# Patient Record
Sex: Female | Born: 1965 | Race: Black or African American | Hispanic: No | Smoking: Never smoker
Health system: Southern US, Community
[De-identification: ages and names within clinical notes are randomized; demographics above are authoritative.]

## PROBLEM LIST (undated history)

## (undated) DIAGNOSIS — D573 Sickle-cell trait: Secondary | ICD-10-CM

## (undated) DIAGNOSIS — D649 Anemia, unspecified: Secondary | ICD-10-CM

## (undated) DIAGNOSIS — B079 Viral wart, unspecified: Secondary | ICD-10-CM

## (undated) DIAGNOSIS — M259 Joint disorder, unspecified: Secondary | ICD-10-CM

## (undated) DIAGNOSIS — B009 Herpesviral infection, unspecified: Secondary | ICD-10-CM

## (undated) DIAGNOSIS — B379 Candidiasis, unspecified: Secondary | ICD-10-CM

## (undated) DIAGNOSIS — M329 Systemic lupus erythematosus, unspecified: Secondary | ICD-10-CM

## (undated) DIAGNOSIS — A599 Trichomoniasis, unspecified: Secondary | ICD-10-CM

## (undated) HISTORY — DX: Candidiasis, unspecified: B37.9

## (undated) HISTORY — DX: Anemia, unspecified: D64.9

## (undated) HISTORY — DX: Trichomoniasis, unspecified: A59.9

## (undated) HISTORY — DX: Joint disorder, unspecified: M25.9

## (undated) HISTORY — PX: CHOLECYSTECTOMY: SHX55

## (undated) HISTORY — DX: Herpesviral infection, unspecified: B00.9

## (undated) HISTORY — DX: Systemic lupus erythematosus, unspecified: M32.9

## (undated) HISTORY — DX: Sickle-cell trait: D57.3

## (undated) HISTORY — DX: Viral wart, unspecified: B07.9

---

## 1999-03-25 ENCOUNTER — Other Ambulatory Visit: Admission: RE | Admit: 1999-03-25 | Discharge: 1999-03-25 | Payer: Self-pay | Admitting: Obstetrics and Gynecology

## 2009-11-14 ENCOUNTER — Ambulatory Visit: Payer: Self-pay | Admitting: Rheumatology

## 2010-06-27 ENCOUNTER — Observation Stay (HOSPITAL_COMMUNITY): Admission: EM | Admit: 2010-06-27 | Discharge: 2010-06-28 | Payer: Self-pay | Admitting: Emergency Medicine

## 2010-06-27 ENCOUNTER — Emergency Department (HOSPITAL_COMMUNITY): Admission: EM | Admit: 2010-06-27 | Discharge: 2010-06-27 | Payer: Self-pay | Admitting: Family Medicine

## 2010-06-27 ENCOUNTER — Ambulatory Visit: Payer: Self-pay | Admitting: Cardiology

## 2010-06-28 ENCOUNTER — Encounter (INDEPENDENT_AMBULATORY_CARE_PROVIDER_SITE_OTHER): Payer: Self-pay | Admitting: Emergency Medicine

## 2010-07-01 ENCOUNTER — Ambulatory Visit: Payer: Self-pay | Admitting: Rheumatology

## 2011-01-24 LAB — POCT CARDIAC MARKERS
Myoglobin, poc: 49 ng/mL (ref 12–200)
Myoglobin, poc: 52.8 ng/mL (ref 12–200)
Troponin i, poc: <0.05 ng/mL (ref 0.00–0.09)
Troponin i, poc: <0.05 ng/mL (ref 0.00–0.09)

## 2011-01-24 LAB — URINE MICROSCOPIC-ADD ON

## 2011-01-24 LAB — URINALYSIS, ROUTINE W REFLEX MICROSCOPIC
Protein, ur: NEGATIVE mg/dL
Specific Gravity, Urine: 1.015 (ref 1.005–1.030)
Urobilinogen, UA: 1 mg/dL (ref 0.0–1.0)
pH: 6.5 (ref 5.0–8.0)

## 2011-01-24 LAB — POCT I-STAT, CHEM 8
BUN: 8 mg/dL (ref 6–23)
Glucose, Bld: 75 mg/dL (ref 70–99)
HCT: 36 % (ref 36.0–46.0)
Hemoglobin: 12.2 g/dL (ref 12.0–15.0)
Sodium: 140 mEq/L (ref 135–145)

## 2011-01-24 LAB — PREGNANCY, URINE: Preg Test, Ur: NEGATIVE

## 2011-01-24 LAB — CBC
MCH: 29.6 pg (ref 26.0–34.0)
MCV: 85.2 fL (ref 78.0–100.0)
RBC: 4.12 MIL/uL (ref 3.87–5.11)
RDW: 12.2 % (ref 11.5–15.5)

## 2011-01-24 LAB — COMPREHENSIVE METABOLIC PANEL
CO2: 27 mEq/L (ref 19–32)
Chloride: 102 mEq/L (ref 96–112)
Creatinine, Ser: 0.78 mg/dL (ref 0.4–1.2)
GFR calc Af Amer: >60 mL/min (ref >60)
Potassium: 3.6 mEq/L (ref 3.5–5.1)
Sodium: 134 mEq/L — ABNORMAL LOW (ref 135–145)

## 2013-01-12 ENCOUNTER — Ambulatory Visit: Payer: 59 | Admitting: Obstetrics and Gynecology

## 2013-01-12 ENCOUNTER — Encounter: Payer: Self-pay | Admitting: Obstetrics and Gynecology

## 2013-01-12 VITALS — BP 110/80 | Resp 16 | Ht 68.0 in | Wt 213.0 lb

## 2013-01-12 DIAGNOSIS — M329 Systemic lupus erythematosus, unspecified: Secondary | ICD-10-CM

## 2013-01-12 DIAGNOSIS — D649 Anemia, unspecified: Secondary | ICD-10-CM

## 2013-01-12 DIAGNOSIS — D573 Sickle-cell trait: Secondary | ICD-10-CM | POA: Insufficient documentation

## 2013-01-12 DIAGNOSIS — Z124 Encounter for screening for malignant neoplasm of cervix: Secondary | ICD-10-CM

## 2013-01-12 HISTORY — DX: Systemic lupus erythematosus, unspecified: M32.9

## 2013-01-12 MED ORDER — MEDROXYPROGESTERONE ACETATE 150 MG/ML IM SUSP: 150.0000 mg | Freq: Once | INTRAMUSCULAR | Status: DC

## 2013-01-12 NOTE — Progress Notes (Signed)
New pt Annual Exam:  Last Pap: 04-04-11 at [facility] in [place], [place]  WNL: Yes Regular Periods:yes Contraception: NONE   Monthly Breast exam:no Tetanus<75yrs:yes Nl.Bladder Function:yes Daily BMs:yes Healthy Diet:yes Calcium:no Mammogram:no Date of Mammogram: Never Exercise:no Have often Exercise: Never Seatbelt: yes Abuse at home: no Stressful work:yes Sigmoid-colonoscopy: Never Bone Density: No PCP: NO PCP  Change in PMH:Dx w/  Lupus in 2003-04-04  Change in FMH:Mom 04-04-03) and MaternalAunt died of Lupus   Subjective:    Charlotte Cummings is a 47 y.o. female, G4P2, who presents for an annual exam.     History   Social History  . Marital Status: Divorced    Spouse Name: N/A    Number of Children: N/A  . Years of Education: N/A   Social History Main Topics  . Smoking status: Never Smoker   . Smokeless tobacco: Never Used  . Alcohol Use: No  . Drug Use: No  . Sexually Active: Yes -- Female partner(s)    Birth Control/ Protection: None   Other Topics Concern  . None   Social History Narrative  . None    Menstrual cycle:   LMP: Patient's last menstrual period was 12/26/2012.           Cycle: regular for 3-4 days, no IM bleeding    Used Depo Provera between her children with amenorrhea with injections  The following portions of the patient's history were reviewed and updated as appropriate: allergies, current medications, past family history, past medical history, past social history, past surgical history and problem list.  Review of Systems Pertinent items are noted in HPI. Breast:Negative for breast lump,nipple discharge or nipple retraction Gastrointestinal: Negative for abdominal pain, change in bowel habits or rectal bleeding Urinary:negative   Objective:    BP 110/80  Resp 16  Ht 5\' 8"  (1.727 m)  Wt 213 lb (96.616 kg)  BMI 32.39 kg/m2  LMP 12/26/2012    Weight:  Wt Readings from Last 1 Encounters:  01/12/13 213 lb (96.616 kg)          BMI: Body mass  index is 32.39 kg/(m^2).  General Appearance: Alert, appropriate appearance for age. No acute distress HEENT: Grossly normal Neck / Thyroid: Supple, no masses, nodes or enlargement Lungs: clear to auscultation bilaterally Back: No CVA tenderness Breast Exam: No masses or nodes.No dimpling, nipple retraction or discharge. Cardiovascular: Regular rate and rhythm. S1, S2, no murmur Gastrointestinal: Soft, non-tender, no masses or organomegaly Pelvic Exam: Vulva and vagina appear normal. Bimanual exam reveals normal uterus and adnexa. Rectovaginal: normal rectal, no masses Lymphatic Exam: Non-palpable nodes in neck, clavicular, axillary, or inguinal regions Skin: no rash or abnormalities Neurologic: Normal gait and speech, no tremor  Psychiatric: Alert and oriented, appropriate affect.   Wet Prep:not applicable Urinalysis:not applicable UPT: Not done   Assessment:    Normal gyn exam SLE with joint pain as only current sx.  followed by Dr. Gavin Potters in [place] hx SLE   Plan:    Mammogram ordered pap smear with HR HPV return annually or prn Contraception:Depo-Provera  Pt has discussed this choice with her rheumatologist and he concurs First injection to be given within first 5 days of menses per protocol, then q 12 weeks

## 2013-01-14 LAB — PAP IG AND HPV HIGH-RISK

## 2013-01-24 ENCOUNTER — Ambulatory Visit
Admission: RE | Admit: 2013-01-24 | Discharge: 2013-01-24 | Disposition: A | Discharge status: Home / Self Care | Payer: 59 | Source: Ambulatory Visit | Attending: Obstetrics and Gynecology | Admitting: Obstetrics and Gynecology

## 2013-01-24 ENCOUNTER — Telehealth: Payer: Self-pay | Admitting: Obstetrics and Gynecology

## 2013-01-24 DIAGNOSIS — Z1231 Encounter for screening mammogram for malignant neoplasm of breast: Secondary | ICD-10-CM

## 2013-01-24 NOTE — Telephone Encounter (Signed)
Spoke with pt but call got disconnected

## 2013-01-25 ENCOUNTER — Telehealth: Payer: Self-pay

## 2013-01-25 ENCOUNTER — Other Ambulatory Visit: Payer: Self-pay | Admitting: Obstetrics and Gynecology

## 2013-01-25 NOTE — Telephone Encounter (Signed)
Tc to pt yesterday rgd rx for depo injection. Advised pt that refills should still be available at her pharmacy before call dropped. Attempted to return pt call, no answer.

## 2013-01-27 ENCOUNTER — Encounter: Payer: Self-pay | Admitting: Obstetrics and Gynecology

## 2013-01-27 DIAGNOSIS — R922 Inconclusive mammogram: Secondary | ICD-10-CM

## 2013-01-27 DIAGNOSIS — R9230 Dense breasts, unspecified: Secondary | ICD-10-CM

## 2013-01-27 HISTORY — DX: Dense breasts, unspecified: R92.30

## 2013-03-21 ENCOUNTER — Ambulatory Visit
Admission: RE | Admit: 2013-03-21 | Discharge: 2013-03-21 | Disposition: A | Discharge status: Home / Self Care | Payer: 59 | Source: Ambulatory Visit | Attending: Obstetrics and Gynecology | Admitting: Obstetrics and Gynecology

## 2013-03-21 DIAGNOSIS — R928 Other abnormal and inconclusive findings on diagnostic imaging of breast: Secondary | ICD-10-CM

## 2014-09-11 ENCOUNTER — Encounter: Payer: Self-pay | Admitting: Obstetrics and Gynecology

## 2015-06-20 DIAGNOSIS — Z79899 Other long term (current) drug therapy: Secondary | ICD-10-CM

## 2015-06-20 DIAGNOSIS — R5383 Other fatigue: Secondary | ICD-10-CM

## 2015-06-20 HISTORY — DX: Other fatigue: R53.83

## 2015-06-20 HISTORY — DX: Other long term (current) drug therapy: Z79.899

## 2019-04-26 DIAGNOSIS — I10 Essential (primary) hypertension: Secondary | ICD-10-CM

## 2019-04-26 HISTORY — DX: Essential (primary) hypertension: I10

## 2019-06-22 ENCOUNTER — Other Ambulatory Visit (HOSPITAL_COMMUNITY)
Admission: RE | Admit: 2019-06-22 | Discharge: 2019-06-22 | Disposition: A | Discharge status: Home / Self Care | Payer: Medicaid Other | Source: Ambulatory Visit | Attending: Obstetrics and Gynecology | Admitting: Obstetrics and Gynecology

## 2019-06-22 ENCOUNTER — Encounter: Payer: Self-pay | Admitting: Obstetrics and Gynecology

## 2019-06-22 ENCOUNTER — Ambulatory Visit: Payer: Medicaid Other | Admitting: Obstetrics and Gynecology

## 2019-06-22 ENCOUNTER — Other Ambulatory Visit: Payer: Self-pay

## 2019-06-22 DIAGNOSIS — Z01419 Encounter for gynecological examination (general) (routine) without abnormal findings: Secondary | ICD-10-CM | POA: Insufficient documentation

## 2019-06-22 DIAGNOSIS — Z0000 Encounter for general adult medical examination without abnormal findings: Secondary | ICD-10-CM

## 2019-06-22 HISTORY — DX: Encounter for gynecological examination (general) (routine) without abnormal findings: Z01.419

## 2019-06-22 NOTE — Progress Notes (Signed)
Pt is here for annual gyn exam. Pt reports last pap 02/2018, normal. Pt reports last MMG was also 02/2018, normal. Pt reports she has not ever had a colonoscopy. Pt would like to get back on Depo-provera for contraception.

## 2019-06-22 NOTE — Progress Notes (Signed)
Charlotte Cummings is a 53 y.o. [phone] female here for a routine annual gynecologic exam. She desires contraception. Sexual active. Monthly cycles. Some menopausal Sx but also has SLE. Previously used Depo Provera, but stopped over a year ago.    Denies abnormal vaginal bleeding, discharge, pelvic pain, problems with intercourse or other gynecologic concerns.    Gynecologic History Patient's last menstrual period was 05/27/2019 (exact date). Contraception: none Last Pap: 2019. Results were: normal   Obstetric History OB History  Gravida Para Term Preterm AB Living  4 2 2   2 2   SAB TAB Ectopic Multiple Live Births    2          # Outcome Date GA Lbr Len/2nd Weight Sex Delivery Anes PTL Lv  4 Term 07/15/01     Vag-Spont     3 Term 01/05/95     Vag-Spont     2 TAB           1 TAB             Past Medical History:  Diagnosis Date  . Anemia   . Herpes   . Joint problem    because of Lupus  . Sickle cell trait ([place])   . Systemic lupus erythematosus ([place])   . Trichomonas   . Warts    HPV  . Yeast infection     Past Surgical History:  Procedure Laterality Date  . CHOLECYSTECTOMY      Current Outpatient Medications on File Prior to Visit  Medication Sig Dispense Refill  . amLODipine (NORVASC) 5 MG tablet Take by mouth.    . Cholecalciferol (VITAMIN D3) 50 MCG (2000 UT) capsule Take by mouth.    . hydroxychloroquine (PLAQUENIL) 200 MG tablet Take by mouth daily.     No current facility-administered medications on file prior to visit.     Allergies  Allergen Reactions  . Ciprofloxacin     Social History   Socioeconomic History  . Marital status: Divorced    Spouse name: Not on file  . Number of children: Not on file  . Years of education: Not on file  . Highest education level: Not on file  Occupational History  . Occupation: [employer]  Social Needs  . Financial resource strain: Not on file  . Food insecurity    Worry: Not on file    Inability: Not on file  .  Transportation needs    Medical: Not on file    Non-medical: Not on file  Tobacco Use  . Smoking status: Never Smoker  . Smokeless tobacco: Never Used  Substance and Sexual Activity  . Alcohol use: No  . Drug use: No  . Sexual activity: Yes    Partners: Male    Birth control/protection: None  Lifestyle  . Physical activity    Days per week: Not on file    Minutes per session: Not on file  . Stress: Not on file  Relationships  . Social Herbalist on phone: Not on file    Gets together: Not on file    Attends religious service: Not on file    Active member of club or organization: Not on file    Attends meetings of clubs or organizations: Not on file    Relationship status: Not on file  . Intimate partner violence    Fear of current or ex partner: Not on file    Emotionally abused: Not on file    Physically  abused: Not on file    Forced sexual activity: Not on file  Other Topics Concern  . Not on file  Social History Narrative  . Not on file    Family History  Problem Relation Age of Onset  . Lupus Mother   . Diabetes Mother   . Asthma Mother   . Emphysema Mother   . Lupus Maternal Aunt   . Thyroid disease Maternal Grandmother   . Asthma Son     The following portions of the patient's history were reviewed and updated as appropriate: allergies, current medications, past family history, past medical history, past social history, past surgical history and problem list.  Review of Systems Pertinent items noted in HPI and remainder of comprehensive ROS otherwise negative.   Objective:  BP (!) 138/95   Pulse 67   Ht 5\' 8"  (1.727 m)   Wt 190 lb 8 oz (86.4 kg)   LMP 05/27/2019 (Exact Date)   BMI 28.97 kg/m  CONSTITUTIONAL: Well-developed, well-nourished female in no acute distress.  HENT:  Normocephalic, atraumatic, External right and left ear normal. Oropharynx is clear and moist EYES: Conjunctivae and EOM are normal. Pupils are equal, round, and  reactive to light. No scleral icterus.  NECK: Normal range of motion, supple, no masses.  Normal thyroid.  SKIN: Skin is warm and dry. No rash noted. Not diaphoretic. No erythema. No pallor. NEUROLGIC: Alert and oriented to person, place, and time. Normal reflexes, muscle tone coordination. No cranial nerve deficit noted. PSYCHIATRIC: Normal mood and affect. Normal behavior. Normal judgment and thought content. CARDIOVASCULAR: Normal heart rate noted, regular rhythm RESPIRATORY: Clear to auscultation bilaterally. Effort and breath sounds normal, no problems with respiration noted. BREASTS: Symmetric in size. No masses, skin changes, nipple drainage, or lymphadenopathy. ABDOMEN: Soft, normal bowel sounds, no distention noted.  No tenderness, rebound or guarding.  PELVIC: Normal appearing external genitalia; normal appearing vaginal mucosa and cervix.  No abnormal discharge noted.  Pap smear obtained.  Normal uterine size, no other palpable masses, no uterine or adnexal tenderness. MUSCULOSKELETAL: Normal range of motion. No tenderness.  No cyanosis, clubbing, or edema.  2+ distal pulses.   Assessment:  Annual gynecologic examination with pap smear Contraception management Screening mammogram  Plan:  Will follow up results of pap smear and manage accordingly. Mammogram scheduled Discussed contraception options with pt, following discussion. Pt interested in ParaGard. Information provided to pt for reviewed.  Routine preventative health maintenance measures emphasized. Please refer to After Visit Summary for other counseling recommendations.  Pt to schedule IUD insertion with next cycle or 1 week afterwards   Hermina StaggersMichael L Tyrelle Raczka, MD, FACOG Attending Obstetrician & Gynecologist [facility], [facility]

## 2019-06-22 NOTE — Patient Instructions (Signed)
Health Maintenance, Female Adopting a healthy lifestyle and getting preventive care are important in promoting health and wellness. Ask your health care provider about:  The right schedule for you to have regular tests and exams.  Things you can do on your own to prevent diseases and keep yourself healthy. What should I know about diet, weight, and exercise? Eat a healthy diet   Eat a diet that includes plenty of vegetables, fruits, low-fat dairy products, and lean protein.  Do not eat a lot of foods that are high in solid fats, added sugars, or sodium. Maintain a healthy weight Body mass index (BMI) is used to identify weight problems. It estimates body fat based on height and weight. Your health care provider can help determine your BMI and help you achieve or maintain a healthy weight. Get regular exercise Get regular exercise. This is one of the most important things you can do for your health. Most adults should:  Exercise for at least 150 minutes each week. The exercise should increase your heart rate and make you sweat (moderate-intensity exercise).  Do strengthening exercises at least twice a week. This is in addition to the moderate-intensity exercise.  Spend less time sitting. Even light physical activity can be beneficial. Watch cholesterol and blood lipids Have your blood tested for lipids and cholesterol at 52 years of age, then have this test every 5 years. Have your cholesterol levels checked more often if:  Your lipid or cholesterol levels are high.  You are older than 52 years of age.  You are at high risk for heart disease. What should I know about cancer screening? Depending on your health history and family history, you may need to have cancer screening at various ages. This may include screening for:  Breast cancer.  Cervical cancer.  Colorectal cancer.  Skin cancer.  Lung cancer. What should I know about heart disease, diabetes, and high blood  pressure? Blood pressure and heart disease  High blood pressure causes heart disease and increases the risk of stroke. This is more likely to develop in people who have high blood pressure readings, are of African descent, or are overweight.  Have your blood pressure checked: ? Every 3-5 years if you are 18-39 years of age. ? Every year if you are 40 years old or older. Diabetes Have regular diabetes screenings. This checks your fasting blood sugar level. Have the screening done:  Once every three years after age 40 if you are at a normal weight and have a low risk for diabetes.  More often and at a younger age if you are overweight or have a high risk for diabetes. What should I know about preventing infection? Hepatitis B If you have a higher risk for hepatitis B, you should be screened for this virus. Talk with your health care provider to find out if you are at risk for hepatitis B infection. Hepatitis C Testing is recommended for:  Everyone born from 1945 through 1965.  Anyone with known risk factors for hepatitis C. Sexually transmitted infections (STIs)  Get screened for STIs, including gonorrhea and chlamydia, if: ? You are sexually active and are younger than 52 years of age. ? You are older than 52 years of age and your health care provider tells you that you are at risk for this type of infection. ? Your sexual activity has changed since you were last screened, and you are at increased risk for chlamydia or gonorrhea. Ask your health care provider if   you are at risk.  Ask your health care provider about whether you are at high risk for HIV. Your health care provider may recommend a prescription medicine to help prevent HIV infection. If you choose to take medicine to prevent HIV, you should first get tested for HIV. You should then be tested every 3 months for as long as you are taking the medicine. Pregnancy  If you are about to stop having your period (premenopausal) and  you may become pregnant, seek counseling before you get pregnant.  Take 400 to 800 micrograms (mcg) of folic acid every day if you become pregnant.  Ask for birth control (contraception) if you want to prevent pregnancy. Osteoporosis and menopause Osteoporosis is a disease in which the bones lose minerals and strength with aging. This can result in bone fractures. If you are 65 years old or older, or if you are at risk for osteoporosis and fractures, ask your health care provider if you should:  Be screened for bone loss.  Take a calcium or vitamin D supplement to lower your risk of fractures.  Be given hormone replacement therapy (HRT) to treat symptoms of menopause. Follow these instructions at home: Lifestyle  Do not use any products that contain nicotine or tobacco, such as cigarettes, e-cigarettes, and chewing tobacco. If you need help quitting, ask your health care provider.  Do not use street drugs.  Do not share needles.  Ask your health care provider for help if you need support or information about quitting drugs. Alcohol use  Do not drink alcohol if: ? Your health care provider tells you not to drink. ? You are pregnant, may be pregnant, or are planning to become pregnant.  If you drink alcohol: ? Limit how much you use to 0-1 drink a day. ? Limit intake if you are breastfeeding.  Be aware of how much alcohol is in your drink. In the [place] one drink equals one 12 oz bottle of beer (355 mL), one 5 oz glass of wine (148 mL), or one 1 oz glass of hard liquor (44 mL). General instructions  Schedule regular health, dental, and eye exams.  Stay current with your vaccines.  Tell your health care provider if: ? You often feel depressed. ? You have ever been abused or do not feel safe at home. Summary  Adopting a healthy lifestyle and getting preventive care are important in promoting health and wellness.  Follow your health care provider's instructions about healthy  diet, exercising, and getting tested or screened for diseases.  Follow your health care provider's instructions on monitoring your cholesterol and blood pressure. This information is not intended to replace advice given to you by your health care provider. Make sure you discuss any questions you have with your health care provider. Document Released: 05/12/2011 Document Revised: 10/20/2018 Document Reviewed: 10/20/2018 Elsevier Patient Education  2020 [employer]  

## 2019-06-24 LAB — CERVICOVAGINAL ANCILLARY ONLY
Bacterial vaginitis: POSITIVE — AB
Candida vaginitis: NEGATIVE
Chlamydia: NEGATIVE
Neisseria Gonorrhea: NEGATIVE
Trichomonas: NEGATIVE

## 2019-06-27 ENCOUNTER — Other Ambulatory Visit: Payer: Self-pay

## 2019-06-27 DIAGNOSIS — B9689 Other specified bacterial agents as the cause of diseases classified elsewhere: Secondary | ICD-10-CM

## 2019-06-27 DIAGNOSIS — N760 Acute vaginitis: Secondary | ICD-10-CM

## 2019-06-27 LAB — CYTOLOGY - PAP
Diagnosis: NEGATIVE
HPV: DETECTED — AB

## 2019-06-27 MED ORDER — METRONIDAZOLE 500 MG PO TABS: 500.0000 mg | ORAL_TABLET | Freq: Two times a day (BID) | ORAL | 0 refills | Status: DC

## 2019-07-25 ENCOUNTER — Ambulatory Visit: Payer: Medicaid Other | Admitting: Obstetrics and Gynecology

## 2019-07-28 ENCOUNTER — Telehealth: Payer: Self-pay

## 2019-07-28 ENCOUNTER — Other Ambulatory Visit: Payer: Self-pay

## 2019-07-28 DIAGNOSIS — Z3042 Encounter for surveillance of injectable contraceptive: Secondary | ICD-10-CM

## 2019-07-28 MED ORDER — MEDROXYPROGESTERONE ACETATE 150 MG/ML IM SUSP: 150.0000 mg | Freq: Once | INTRAMUSCULAR | 3 refills | Status: DC

## 2019-07-28 NOTE — Telephone Encounter (Signed)
TC from pt stating that she does not want copper IUD at this time (07/25/19 IUD insertion appt was cancelled br pt )   wishes to restart Depo. Pt noted she will not be able to come in office this month.  Want Depo Rx sent to [facility] on [address].

## 2019-07-28 NOTE — Telephone Encounter (Signed)
Pt made aware that Rx has been sent Pt voiced understanding.

## 2019-07-28 NOTE — Progress Notes (Signed)
Rx send as advised by Dr.Erin per pt request Pt declines IUD at this time.

## 2019-07-28 NOTE — Telephone Encounter (Signed)
Ok to sent in Rx for Depo Provera

## 2019-08-01 ENCOUNTER — Other Ambulatory Visit: Payer: Self-pay

## 2019-08-01 ENCOUNTER — Ambulatory Visit
Admission: RE | Admit: 2019-08-01 | Discharge: 2019-08-01 | Disposition: A | Discharge status: Home / Self Care | Payer: Medicaid Other | Source: Ambulatory Visit | Attending: Obstetrics and Gynecology | Admitting: Obstetrics and Gynecology

## 2019-08-01 DIAGNOSIS — Z01419 Encounter for gynecological examination (general) (routine) without abnormal findings: Secondary | ICD-10-CM

## 2019-08-03 ENCOUNTER — Other Ambulatory Visit: Payer: Self-pay | Admitting: Obstetrics and Gynecology

## 2019-08-03 DIAGNOSIS — R928 Other abnormal and inconclusive findings on diagnostic imaging of breast: Secondary | ICD-10-CM

## 2019-08-09 ENCOUNTER — Other Ambulatory Visit: Payer: Self-pay

## 2019-08-09 ENCOUNTER — Ambulatory Visit
Admission: RE | Admit: 2019-08-09 | Discharge: 2019-08-09 | Disposition: A | Discharge status: Home / Self Care | Payer: Medicaid Other | Source: Ambulatory Visit | Attending: Obstetrics and Gynecology | Admitting: Obstetrics and Gynecology

## 2019-08-09 ENCOUNTER — Other Ambulatory Visit: Payer: Self-pay | Admitting: Obstetrics and Gynecology

## 2019-08-09 DIAGNOSIS — N6310 Unspecified lump in the right breast, unspecified quadrant: Secondary | ICD-10-CM

## 2019-08-09 DIAGNOSIS — R928 Other abnormal and inconclusive findings on diagnostic imaging of breast: Secondary | ICD-10-CM

## 2019-08-10 ENCOUNTER — Ambulatory Visit
Admission: RE | Admit: 2019-08-10 | Discharge: 2019-08-10 | Disposition: A | Discharge status: Home / Self Care | Payer: Medicaid Other | Source: Ambulatory Visit | Attending: Obstetrics and Gynecology | Admitting: Obstetrics and Gynecology

## 2019-08-10 ENCOUNTER — Other Ambulatory Visit (HOSPITAL_COMMUNITY): Payer: Self-pay | Admitting: Diagnostic Radiology

## 2019-08-10 DIAGNOSIS — N6310 Unspecified lump in the right breast, unspecified quadrant: Secondary | ICD-10-CM

## 2020-10-08 ENCOUNTER — Other Ambulatory Visit: Payer: Self-pay

## 2020-10-08 ENCOUNTER — Encounter (HOSPITAL_COMMUNITY): Payer: Self-pay

## 2020-10-08 ENCOUNTER — Emergency Department (HOSPITAL_COMMUNITY)
Admission: EM | Admit: 2020-10-08 | Discharge: 2020-10-08 | Disposition: A | Discharge status: Home / Self Care | Payer: 59 | Attending: Emergency Medicine | Admitting: Emergency Medicine

## 2020-10-08 ENCOUNTER — Emergency Department (HOSPITAL_BASED_OUTPATIENT_CLINIC_OR_DEPARTMENT_OTHER): Payer: 59

## 2020-10-08 DIAGNOSIS — M79661 Pain in right lower leg: Secondary | ICD-10-CM | POA: Insufficient documentation

## 2020-10-08 DIAGNOSIS — Z79899 Other long term (current) drug therapy: Secondary | ICD-10-CM | POA: Diagnosis not present

## 2020-10-08 DIAGNOSIS — R609 Edema, unspecified: Secondary | ICD-10-CM

## 2020-10-08 DIAGNOSIS — M79604 Pain in right leg: Secondary | ICD-10-CM

## 2020-10-08 NOTE — ED Triage Notes (Signed)
Patient reports that she has different pain in her right leg that started today. Patient states this is not joint pain and was told to come to the ED to r/o DVT.

## 2020-10-08 NOTE — ED Provider Notes (Signed)
[facility]-EMERGENCY DEPT Provider Note   CSN: [id number] Arrival date & time: 10/08/20  1304     History Chief Complaint  Patient presents with  . Leg Pain    Charlotte Cummings is a 54 y.o. female.  HPI   Patient presents to the ED for evaluation of pain in her right calf.  Patient has a history of lupus.  She does have some issues with pain in her extremities but it is primarily in the joints.  Patient states this morning she noticed pain and discomfort in her right calf area.  It also felt somewhat swollen.  She denies any recent falls or injuries.  She does walk regularly but denies any other new strenuous activity or exertion.  She is not having any pain in her upper leg.  She is not having any numbness or weakness.  No fevers or chills.  Symptoms are worse with activity.  Right now the symptoms are mild.  Patient [facility].  They suggested evaluation to rule out DVT.  Past Medical History:  Diagnosis Date  . Anemia   . Herpes   . Joint problem    because of Lupus  . Sickle cell trait (HCC)   . Systemic lupus erythematosus (HCC)   . Trichomonas   . Warts    HPV  . Yeast infection     Patient Active Problem List   Diagnosis Date Noted  . Visit for routine gyn exam 06/22/2019  . Dense breasts 01/27/2013  . SLE (systemic lupus erythematosus) (HCC) 01/12/2013  . Anemia 01/12/2013  . Sickle cell trait [facility](HCC)     Past Surgical History:  Procedure Laterality Date  . CHOLECYSTECTOMY       OB History    Gravida  4   Para  2   Term  2   Preterm      AB  2   Living  2     SAB      TAB  2   Ectopic      Multiple      Live Births              Family History  Problem Relation Age of Onset  . Lupus Mother   . Diabetes Mother   . Asthma Mother   . Emphysema Mother   . Lupus Maternal Aunt   . Thyroid disease Maternal Grandmother   . Asthma Son     Social History    Tobacco Use  . Smoking status: Never Smoker  . Smokeless tobacco: Never Used  Vaping Use  . Vaping Use: Never used  Substance Use Topics  . Alcohol use: No  . Drug use: No    Home Medications Prior to Admission medications   Medication Sig Start Date End Date Taking? Authorizing Provider  amLODipine (NORVASC) 5 MG tablet Take by mouth. 04/26/19   [provider]  Cholecalciferol (VITAMIN D3) 50 MCG (2000 UT) capsule Take by mouth.    [provider]  hydroxychloroquine (PLAQUENIL) 200 MG tablet Take by mouth daily.    [provider]  medroxyPROGESTERone (DEPO-PROVERA) 150 MG/ML injection Inject 1 mL (150 mg total) into the muscle once for 1 dose. 07/28/19 07/28/19  Hermina StaggersErvin, Michael L, MD  metroNIDAZOLE (FLAGYL) 500 MG tablet Take 1 tablet (500 mg total) by mouth 2 (two) times daily. 06/27/19   Hermina StaggersErvin, Michael L, MD    Allergies  Ciprofloxacin  Review of Systems   Review of Systems  All other systems reviewed and are negative.   Physical Exam Updated Vital Signs BP (!) 153/91 (BP Location: Left Arm)   Pulse 66   Temp 98.2 F (36.8 C) (Oral)   Resp 16   Ht 1.727 m (5\' 8" )   Wt 88.9 kg   LMP 10/07/2020   SpO2 98%   BMI 29.80 kg/m   Physical Exam Vitals and nursing note reviewed.  Constitutional:      General: She is not in acute distress.    Appearance: She is well-developed.  HENT:     Head: Normocephalic and atraumatic.     Right Ear: External ear normal.     Left Ear: External ear normal.  Eyes:     General: No scleral icterus.       Right eye: No discharge.        Left eye: No discharge.     Conjunctiva/sclera: Conjunctivae normal.  Neck:     Trachea: No tracheal deviation.  Cardiovascular:     Rate and Rhythm: Normal rate.  Pulmonary:     Effort: Pulmonary effort is normal. No respiratory distress.     Breath sounds: No stridor.  Abdominal:     General: There is no distension.  Musculoskeletal:        General: No swelling  or deformity.     Cervical back: Neck supple.     Comments: Mild tenderness palpation right calf, no edema  Skin:    General: Skin is warm and dry.     Findings: No rash.  Neurological:     Mental Status: She is alert.     Cranial Nerves: Cranial nerve deficit: no gross deficits.     ED Results / Procedures / Treatments   Labs (all labs ordered are listed, but only abnormal results are displayed) Labs Reviewed - No data to display  EKG None  Radiology VAS 10/09/2020 LOWER EXTREMITY VENOUS (DVT) (ONLY [facility] & [facility] 7a-7p)  Result Date: 10/08/2020  Lower Venous DVT Study Indications: Edema.  Risk Factors: None identified. Comparison Study: No prior studies. Performing Technologist: 10/10/2020 RVT  Examination Guidelines: A complete evaluation includes B-mode imaging, spectral Doppler, color Doppler, and power Doppler as needed of all accessible portions of each vessel. Bilateral testing is considered an integral part of a complete examination. Limited examinations for reoccurring indications may be performed as noted. The reflux portion of the exam is performed with the patient in reverse Trendelenburg.  +---------+---------------+---------+-----------+----------+--------------+ RIGHT    CompressibilityPhasicitySpontaneityPropertiesThrombus Aging +---------+---------------+---------+-----------+----------+--------------+ CFV      Full           Yes      Yes                                 +---------+---------------+---------+-----------+----------+--------------+ SFJ      Full                                                        +---------+---------------+---------+-----------+----------+--------------+ FV Prox  Full                                                        +---------+---------------+---------+-----------+----------+--------------+  FV Mid   Full                                                         +---------+---------------+---------+-----------+----------+--------------+ FV DistalFull                                                        +---------+---------------+---------+-----------+----------+--------------+ PFV      Full                                                        +---------+---------------+---------+-----------+----------+--------------+ POP      Full           Yes      Yes                                 +---------+---------------+---------+-----------+----------+--------------+ PTV      Full                                                        +---------+---------------+---------+-----------+----------+--------------+ PERO     Full                                                        +---------+---------------+---------+-----------+----------+--------------+   +----+---------------+---------+-----------+----------+--------------+ LEFTCompressibilityPhasicitySpontaneityPropertiesThrombus Aging +----+---------------+---------+-----------+----------+--------------+ CFV Full           Yes      Yes                                 +----+---------------+---------+-----------+----------+--------------+     Summary: RIGHT: - There is no evidence of deep vein thrombosis in the lower extremity.  - No cystic structure found in the popliteal fossa.  LEFT: - No evidence of common femoral vein obstruction.  *See table(s) above for measurements and observations.    Preliminary     Procedures Procedures (including critical care time)  Medications Ordered in ED Medications - No data to display  ED Course  I have reviewed the triage vital signs and the nursing notes.  Pertinent labs & imaging results that were available during my care of the patient were reviewed by me and considered in my medical decision making (see chart for details).    MDM Rules/Calculators/A&P                          Patient presented to the ED for evaluation of  possible DVT.  DVT study is actually negative.  No findings to suggest infection in the ED.  Patient does not  have symptoms that sound like a radiculopathy.  Could be calf muscle strain although there was no definite injury.  Recommend over-the-counter medications and outpatient follow-up if symptoms persist. Final Clinical Impression(s) / ED Diagnoses Final diagnoses:  Pain of right lower extremity    Rx / DC Orders ED Discharge Orders    None       Linwood Dibbles, MD 10/08/20 1654

## 2020-10-08 NOTE — Discharge Instructions (Addendum)
Take over-the-counter medications as needed for pain.  Ultrasound did not show evidence of blood clot.  Follow-up with your doctor next week to be rechecked if the symptoms persist

## 2020-10-08 NOTE — Progress Notes (Signed)
Right lower extremity venous duplex has been completed. Preliminary results can be found in CV Proc through chart review.  Results were given to Jodi Geralds PA.  10/08/20 4:35 PM Olen Cordial RVT

## 2020-12-27 IMAGING — MG MM BREAST LOCALIZATION CLIP
4 series · 4 of 12 positions shown · non-contrast
Comparison: Previous exam(s).

CLINICAL DATA: Status post ultrasound-guided core needle biopsy of
a 1.1 cm mass in the 1 o'clock position of the right breast.

EXAM:
DIAGNOSTIC RIGHT MAMMOGRAM POST ULTRASOUND BIOPSY

[R CC synth-2D]
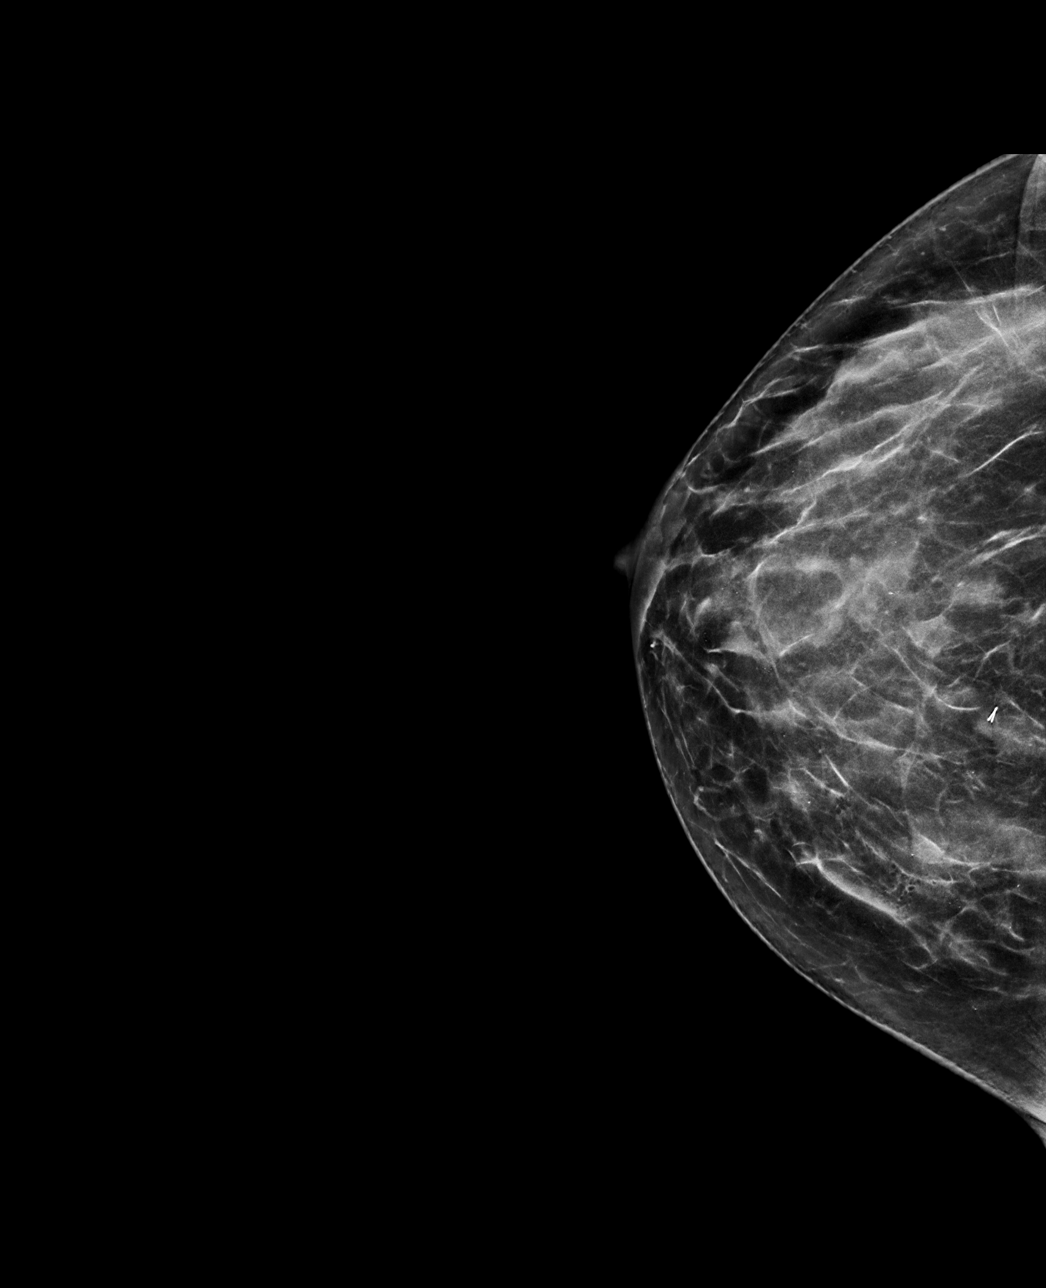

[R ML synth-2D]
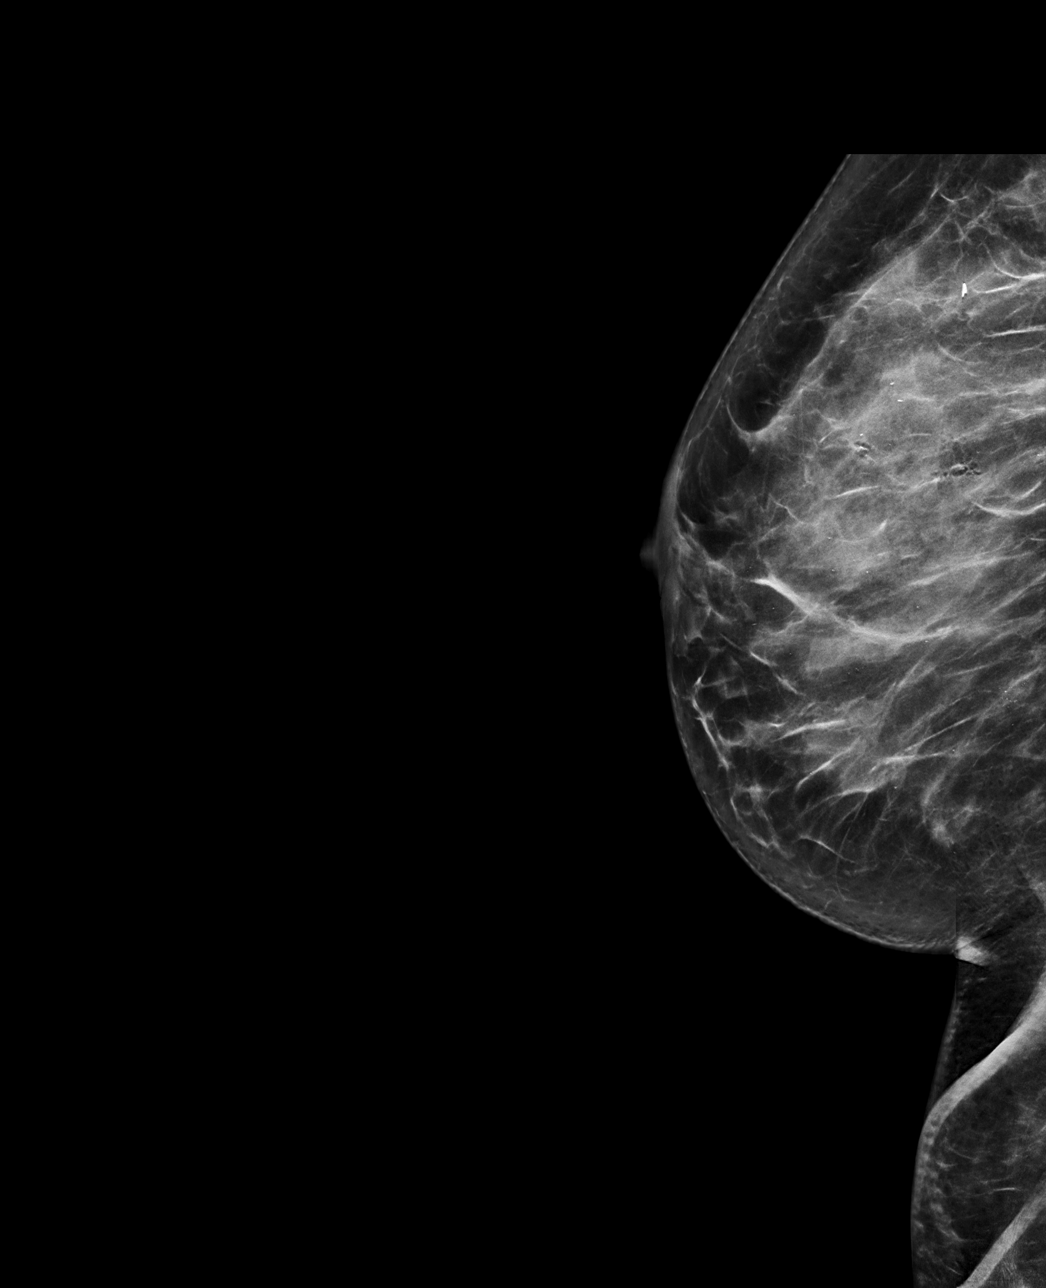

[R ML tomo · tomo slice 39/76.0]
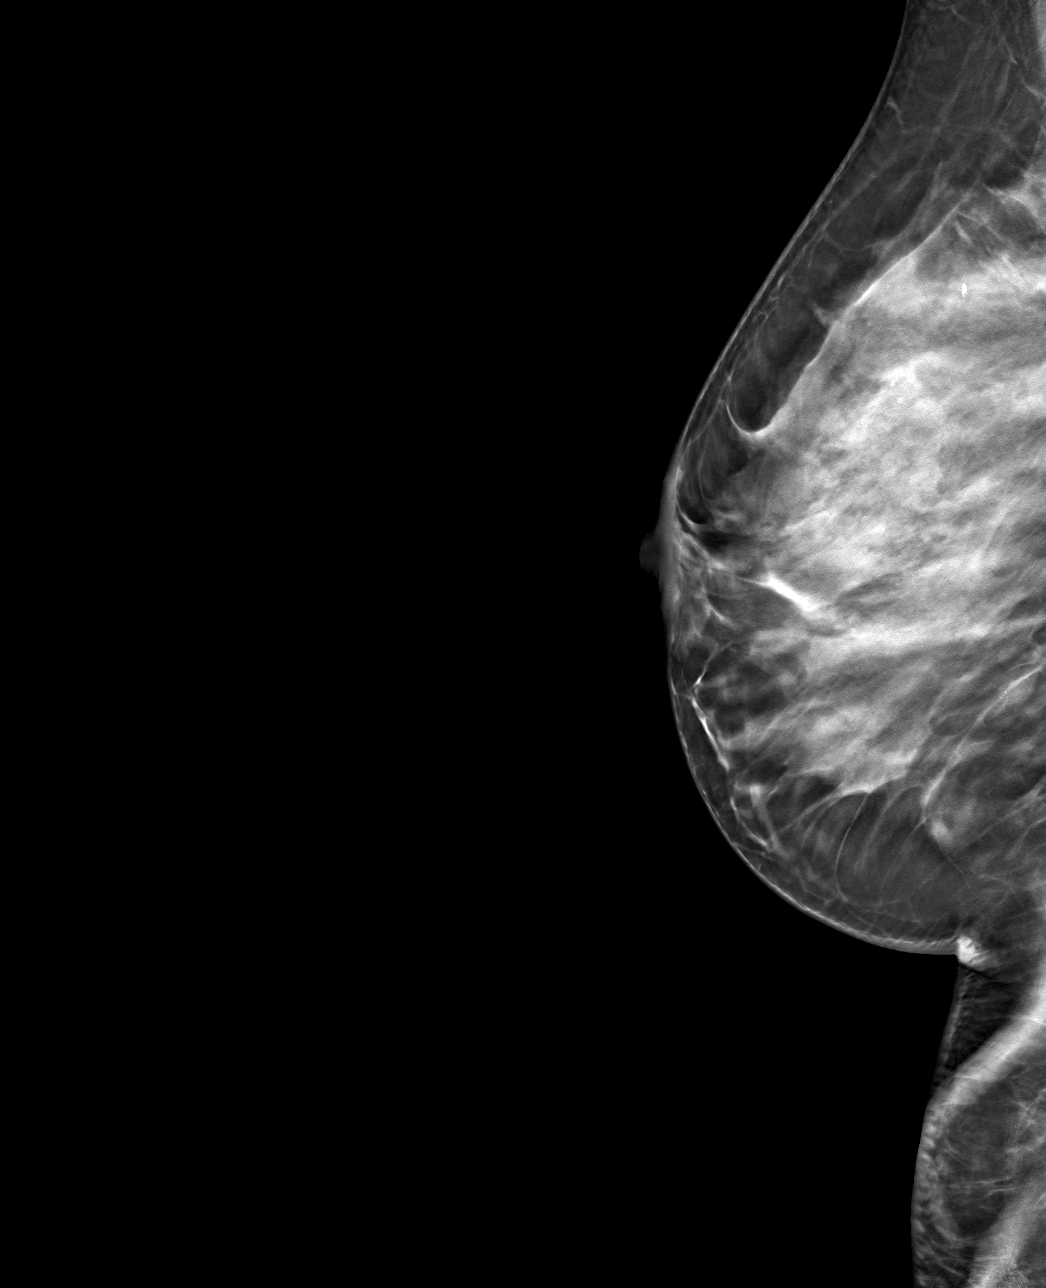

[R CC tomo · tomo slice 35/69.0]
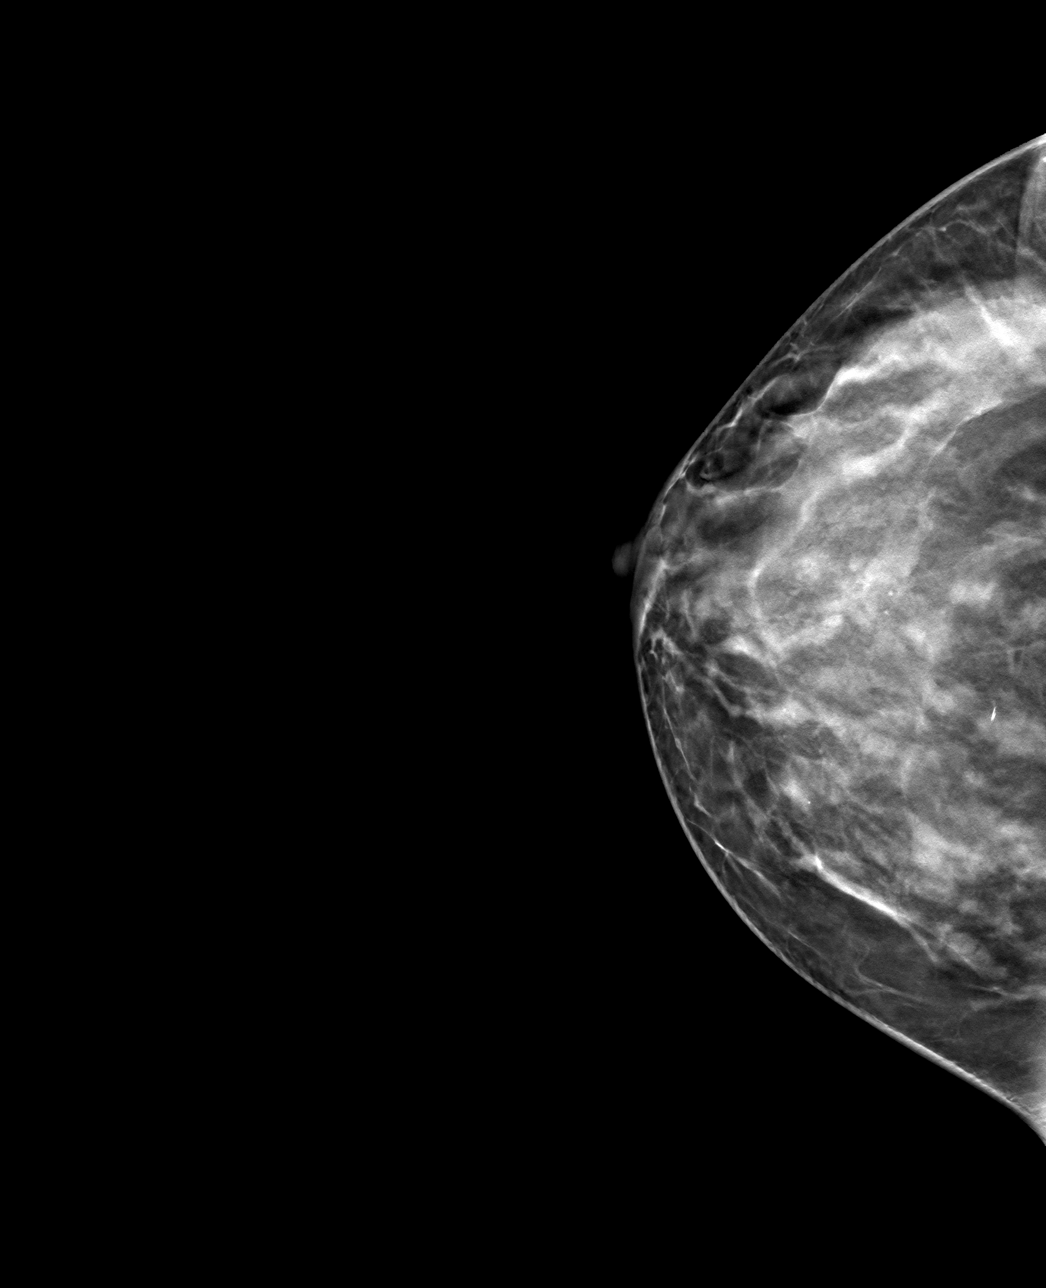

[4 of 12 positions shown; findings below may reference images not displayed]

FINDINGS: Mammographic images were obtained following ultrasound guided biopsy
of the recently demonstrated 1.1 cm mass in the 1 o'clock position
of the right breast. These demonstrate a ribbon shaped clip within
the anterior aspect of the biopsied mass.
IMPRESSION: Appropriate clip deployment following right breast ultrasound-guided
core needle biopsy.

Final Assessment: Post Procedure Mammograms for Marker Placement

## 2020-12-27 IMAGING — MG US  BREAST BX W/ LOC DEV 1ST LESION IMG BX SPEC US GUIDE*R*
1 series · 8 of 8 positions shown · non-contrast
Comparison: Previous exam(s).
COMPARISON: Previous exam(s).

Addendum:
CLINICAL DATA: 1.1 cm suspicious mass in the 1 o'clock position of
the right breast at recent mammography and ultrasound.

EXAM:
ULTRASOUND GUIDED RIGHT BREAST CORE NEEDLE BIOPSY

[Series 1: MG view · 0.07mm/px · 8 of 13 slices shown]
[im 1/13]
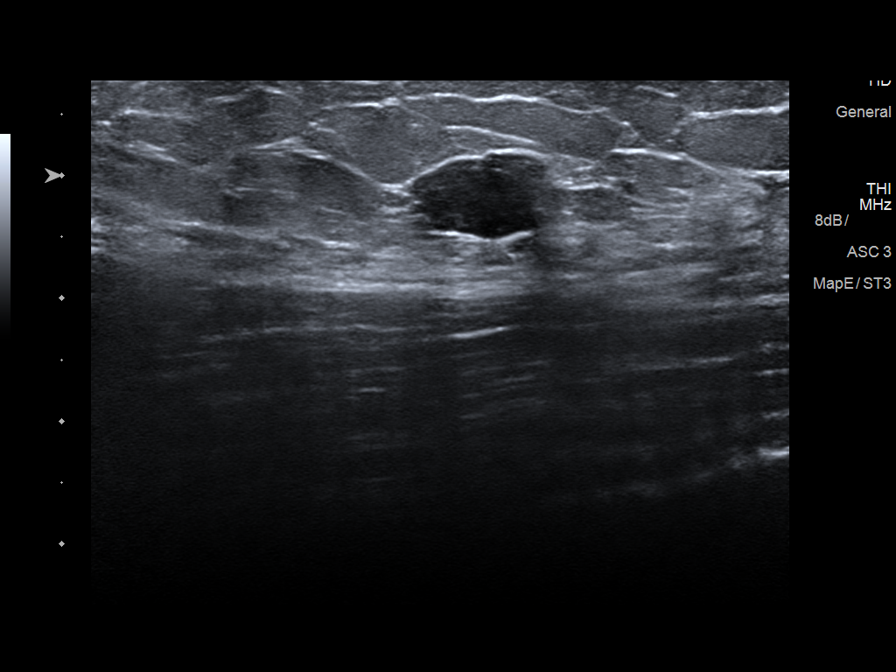
[im 2/13]
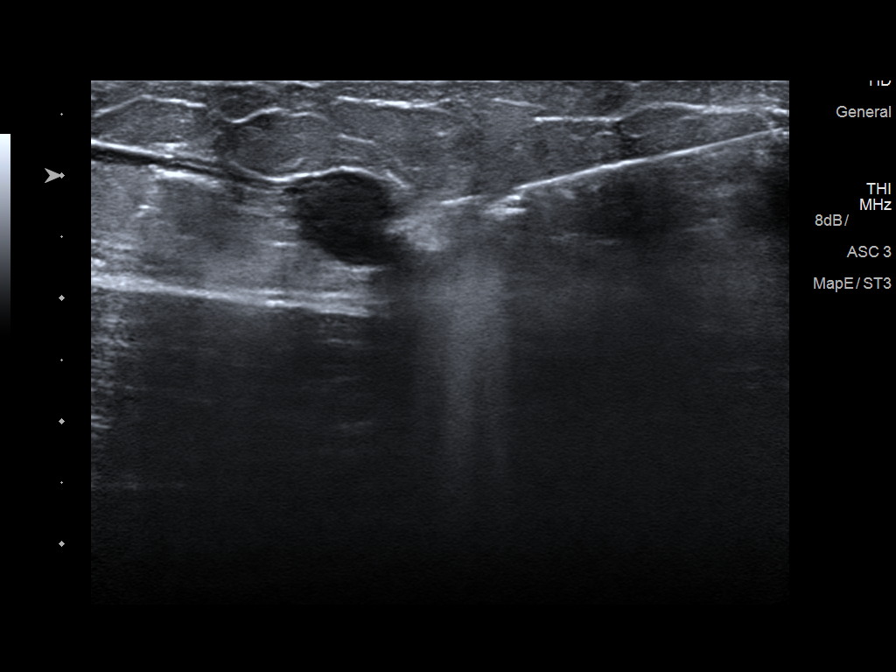
[im 4/13]
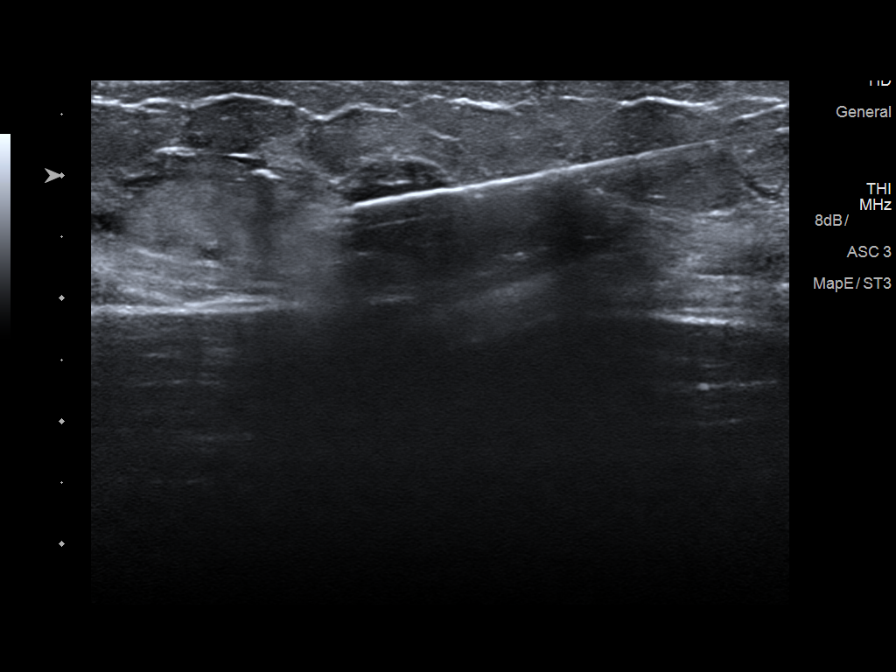
[im 6/13]
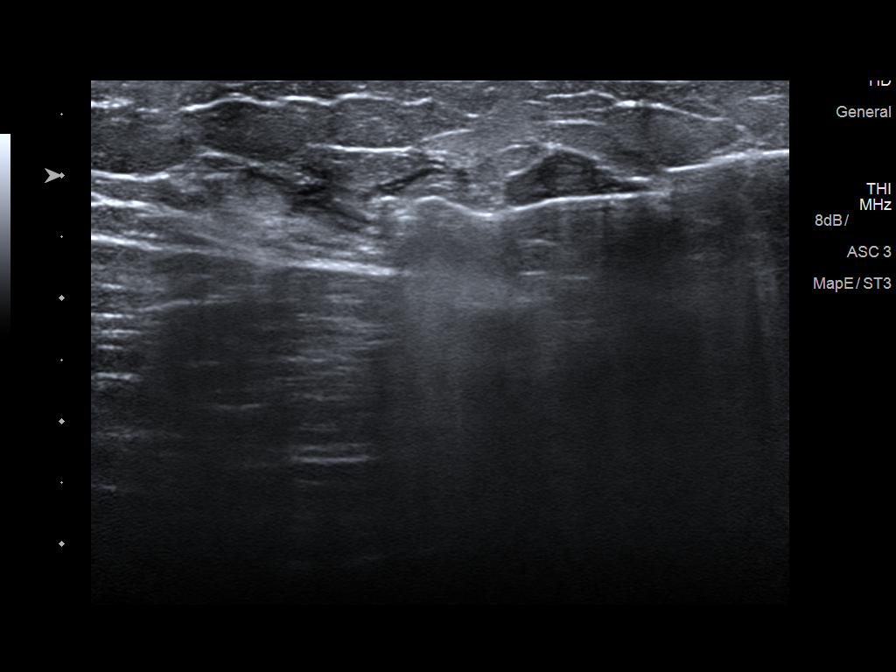
[im 7/13]
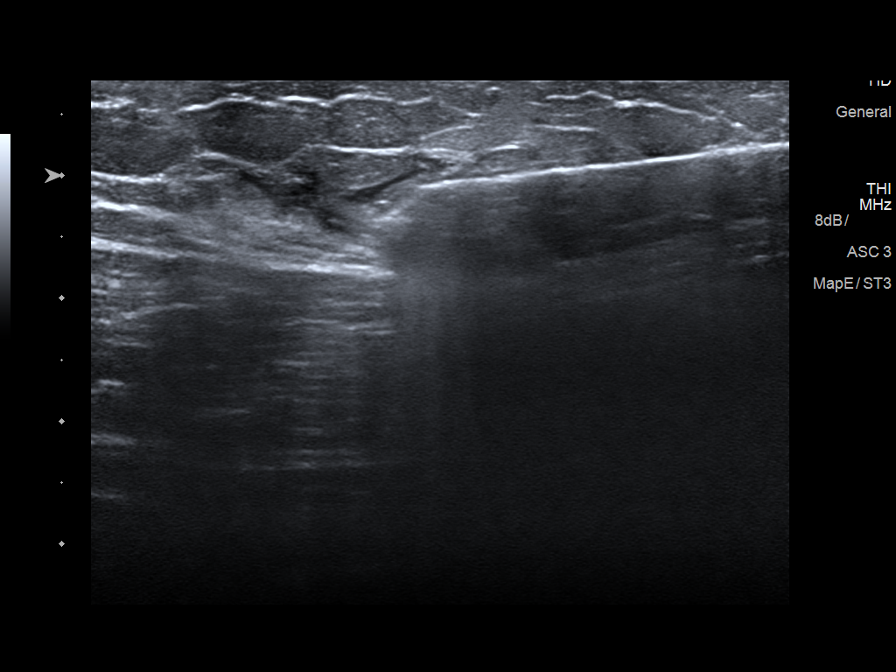
[im 9/13]
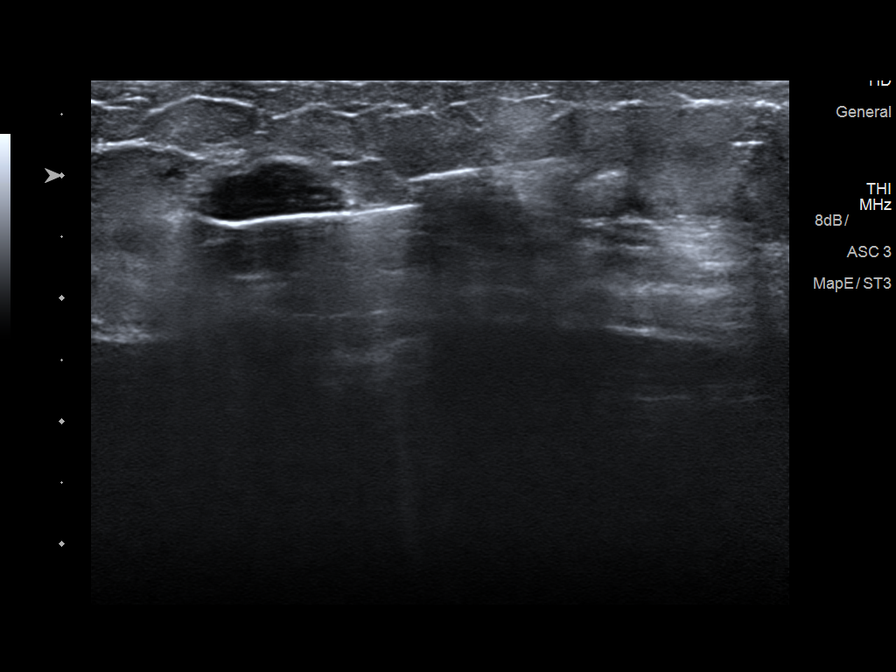
[im 11/13]
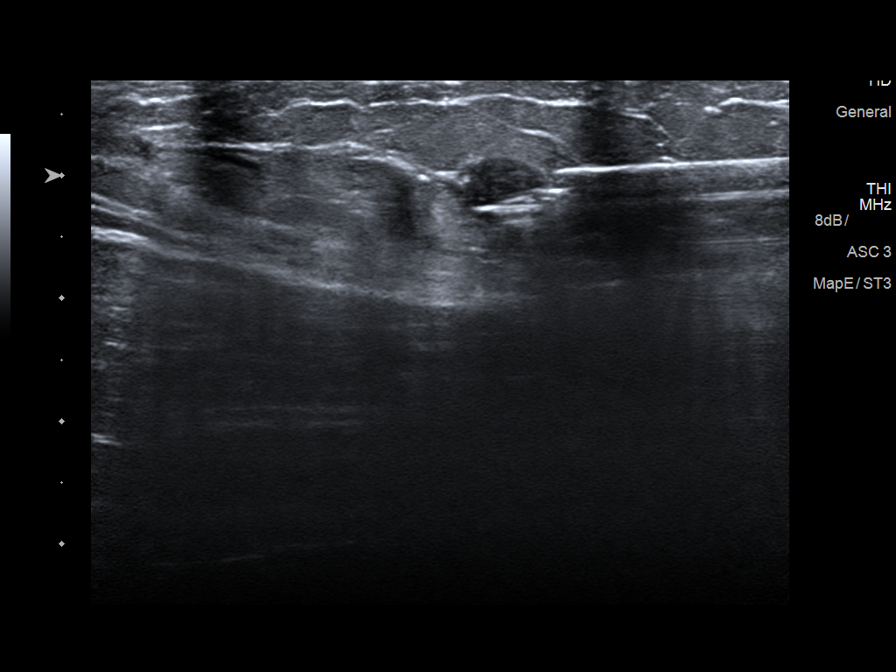
[im 13/13]
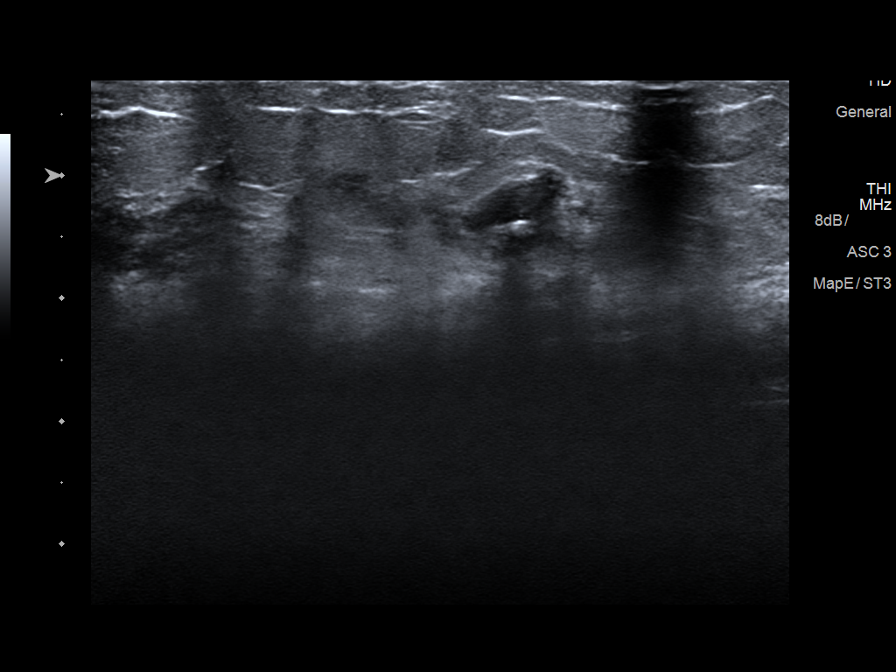

[8 of 8 positions shown; findings below may reference images not displayed]



Lesion quadrant: Upper inner quadrant

Using sterile technique and 1% Lidocaine as local anesthetic, under
direct ultrasound visualization, a 12 gauge Atehortua device was
used to perform biopsy of the recently demonstrated 1.1 cm mass in
the 1 o'clock position of the right breast, 5 cm from the nipple,
using an inferomedial approach. At the conclusion of the procedure
ribbon shaped tissue marker clip was deployed into the biopsy
cavity. Follow up 2 view mammogram was performed and dictated
separately.
IMPRESSION: Ultrasound guided biopsy of the recently demonstrated 1.1 cm mass in
the 1 o'clock position of the right breast. No apparent
complications.

ADDENDUM:
Pathology revealed FIBROADENOMA of the Right breast, 1 o'clock. This
was found to be concordant by Dr. Lilian Graciela Bacino.

Pathology results were discussed with the patient by telephone. The
patient reported doing well after the biopsy with tenderness at the
site. Post biopsy instructions and care were reviewed and questions
were answered. The patient was encouraged to call The [REDACTED]

The patient was instructed to return for annual screening
mammography and informed a reminder notice would be sent regarding
this appointment.

Pathology results reported by Rolf-Einar Jerstad, RN on 08/11/2019.



Lesion quadrant: Upper inner quadrant

Using sterile technique and 1% Lidocaine as local anesthetic, under
direct ultrasound visualization, a 12 gauge Atehortua device was
used to perform biopsy of the recently demonstrated 1.1 cm mass in
the 1 o'clock position of the right breast, 5 cm from the nipple,
using an inferomedial approach. At the conclusion of the procedure
ribbon shaped tissue marker clip was deployed into the biopsy
cavity. Follow up 2 view mammogram was performed and dictated
separately.
IMPRESSION: Ultrasound guided biopsy of the recently demonstrated 1.1 cm mass in
the 1 o'clock position of the right breast. No apparent
complications.

## 2021-03-01 DIAGNOSIS — E559 Vitamin D deficiency, unspecified: Secondary | ICD-10-CM

## 2021-03-01 HISTORY — DX: Vitamin D deficiency, unspecified: E55.9

## 2021-03-04 DIAGNOSIS — D249 Benign neoplasm of unspecified breast: Secondary | ICD-10-CM | POA: Insufficient documentation

## 2021-03-04 HISTORY — DX: Benign neoplasm of unspecified breast: D24.9

## 2021-04-02 ENCOUNTER — Encounter: Payer: Self-pay | Admitting: General Practice

## 2021-04-10 ENCOUNTER — Encounter: Payer: 59 | Admitting: Family Medicine

## 2022-10-04 LAB — COLOGUARD: COLOGUARD: NEGATIVE

## 2022-10-04 LAB — EXTERNAL GENERIC LAB PROCEDURE: COLOGUARD: NEGATIVE

## 2023-07-08 ENCOUNTER — Ambulatory Visit: Payer: 59

## 2023-07-08 VITALS — BP 140/98 | HR 85 | Ht 68.0 in | Wt 186.0 lb

## 2023-07-08 DIAGNOSIS — I10 Essential (primary) hypertension: Secondary | ICD-10-CM

## 2023-07-08 DIAGNOSIS — R55 Syncope and collapse: Secondary | ICD-10-CM | POA: Insufficient documentation

## 2023-07-08 DIAGNOSIS — I272 Pulmonary hypertension, unspecified: Secondary | ICD-10-CM

## 2023-07-08 HISTORY — DX: Syncope and collapse: R55

## 2023-07-08 HISTORY — DX: Pulmonary hypertension, unspecified: I27.20

## 2023-07-08 MED ORDER — AMLODIPINE BESYLATE 10 MG PO TABS
10.0000 mg | ORAL_TABLET | Freq: Every day | ORAL | 3 refills | Status: DC
Start: 2023-07-08 — End: 2024-03-30

## 2023-07-08 NOTE — Assessment & Plan Note (Signed)
Reportedly identified on transthoracic echocardiogram.  Full results not available.  Will request copy of the results for further review.  Likely group 2 in the setting of diastolic dysfunction.  However she does have connective tissue disease which could also be an underlying etiology for pulmonary hypertension.  Discussed about aggressive systemic blood pressure control as above. Dietary salt restriction reviewed.  We optimize systemic blood pressure, will repeat transthoracic echocardiogram for interval change.  If significantly elevated, will pursue further evaluation for pulmonary hypertension.

## 2023-07-08 NOTE — Patient Instructions (Signed)
Medication Instructions:  Your physician has recommended you make the following change in your medication:  Increase Amlodipine to 10 mg once daily  *If you need a refill on your cardiac medications before your next appointment, please call your pharmacy*   Lab Work: NONE If you have labs (blood work) drawn today and your tests are completely normal, you will receive your results only by: MyChart Message (if you have MyChart) OR A paper copy in the mail If you have any lab test that is abnormal or we need to change your treatment, we will call you to review the results.   Testing/Procedures: NONE   Follow-Up: At [facility], you and your health needs are our priority.  As part of our continuing mission to provide you with exceptional heart care, we have created designated Provider Care Teams.  These Care Teams include your primary Cardiologist (physician) and Advanced Practice Providers (APPs -  Physician Assistants and Nurse Practitioners) who all work together to provide you with the care you need, when you need it.  We recommend signing up for the patient portal called "MyChart".  Sign up information is provided on this After Visit Summary.  MyChart is used to connect with patients for Virtual Visits (Telemedicine).  Patients are able to view lab/test results, encounter notes, upcoming appointments, etc.  Non-urgent messages can be sent to your provider as well.   To learn more about what you can do with MyChart, go to [email or web address].    Your next appointment:   6 week(s)  Provider:   Huntley Dec, MD    Other Instructions

## 2023-07-08 NOTE — Assessment & Plan Note (Signed)
Suboptimal control. Target blood pressure below 130 over 80 mmHg. She has been recently started on hydrochlorothiazide 12.5 mg once daily last week and has been tolerating it well.  Titrate up amlodipine dose to 10 mg once daily.

## 2023-07-08 NOTE — Assessment & Plan Note (Signed)
Last episode over a month ago, appears reflect syncope. Workup unremarkable for patient's report about the event monitor findings and echocardiogram findings.

## 2023-07-08 NOTE — Progress Notes (Signed)
Cardiology Consultation:    Date:  07/08/2023   ID:  Charlotte Cummings, DOB 1966-10-30, MRN [id number]  PCP:  Oneita Hurt, No  Cardiologist:  Luretha Murphy, MD   Referring MD: Collene Mares, PA   No chief complaint on file. Hypertension, pulmonary hypertension, grade 1 diastolic dysfunction   ASSESSMENT AND PLAN:   Ms. Dingmann 57 year old woman with history of SLE, hypertension, prior cholecystectomy, 2 episodes of syncope in the past year, reportedly with normal event monitor findings recently and transthoracic echocardiogram findings showing grade 1 diastolic dysfunction and pulmonary hypertension referred for further evaluation. Symptomatically other than mild dyspnea with exertion which appears relatively stable she does not have any acute ongoing cardiovascular symptoms.  Problem List Items Addressed This Visit     Essential hypertension - Primary    Suboptimal control. Target blood pressure below 130 over 80 mmHg. She has been recently started on hydrochlorothiazide 12.5 mg once daily last week and has been tolerating it well.  Titrate up amlodipine dose to 10 mg once daily.      Relevant Medications   hydrochlorothiazide (HYDRODIURIL) 12.5 MG tablet   amLODipine (NORVASC) 10 MG tablet   Other Relevant Orders   EKG 12-Lead (Completed)   Pulmonary hypertension (HCC)    Reportedly identified on transthoracic echocardiogram.  Full results not available.  Will request copy of the results for further review.  Likely group 2 in the setting of diastolic dysfunction.  However she does have connective tissue disease which could also be an underlying etiology for pulmonary hypertension.  Discussed about aggressive systemic blood pressure control as above. Dietary salt restriction reviewed.  We optimize systemic blood pressure, will repeat transthoracic echocardiogram for interval change.  If significantly elevated, will pursue further evaluation for pulmonary hypertension.       Relevant Medications   hydrochlorothiazide (HYDRODIURIL) 12.5 MG tablet   amLODipine (NORVASC) 10 MG tablet   Return to clinic in 6 to 8 weeks to further review blood pressure control and review further diagnostic need for pulmonary hypertension.   History of Present Illness:    Charlotte Cummings is a 57 y.o. female who is being seen today for the evaluation of Hypertension, pulmonary hypertension at the request of [place], [place], [place].   She has diagnosis of SLE [associated with proteinuria, arthralgia, bilateral rash/pleurisy/Raynaud's phenomena], hypertension, cholecystectomy 1998.Marland Kitchen  Recent COVID-19 infection June 11, 2023. Pleasant man here for the visit by herself she works in [employer] office, mostly desk duties.  She previously had follow-up with cardiologist Dr. Dorena Cookey at [facility], last visit January 24, 2020.  She was initially evaluated for atypical chest pain and vague dyspnea symptoms, workup with echocardiogram from July 2020 reported normal biventricular function with LVEF 60 to 65%, no significant valvular abnormalities.  Her symptoms were attributed to noncardiac causes and likely related to underlying Lupus.  Around last week of July she was returning after moving a trash can back in the driveway, walking up the stairs she felt lightheaded and diaphoretic and felt down and briefly lost consciousness, her son promptly helped her up.  No residual weakness.  She had a similar brief syncopal episode over a year ago while getting lab work done.  She mentions echocardiogram and event monitor were ordered by PCP after the syncopal episode. Results of these not available for me at this time for review.  From PCPs referral notes echocardiogram reportedly noted grade 1 diastolic dysfunction and pulmonary hypertension.  Will request full copy  of these results. Zio patch was done May 26, 2023, per patient she activated device multiple times during the monitoring.  Per patient the  results apparently came back normal.  Will have my this request copy of these results.  She denies any further syncopal or near syncopal episodes.  Denies any chest pain.  She however notes a sensation of shortness of breath when she has to do moderate intensity activities.  Denies any orthopnea, paroxysmal nocturnal dyspnea. Mentions blood pressure readings at home typically remain elevated systolic about 140s.  Last lipid panel results from August 2023 total cholesterol 149, HDL 53, LDL and triglyceride levels on scan results are not legible.  Lab results from May 28, 2023 noted ALT low at 6, AST normal at 13, alkaline phosphatase normal at 68 Creatinine 0.85, EGFR 81 Hemoglobin 12.3, hematocrit 36.1, WBC 5.1, platelets 280   Past Medical History:  Diagnosis Date   Anemia    Essential hypertension 04/26/2019   Fatigue 06/20/2015   Herpes    High risk medications (not anticoagulants) long-term use 06/20/2015   Joint problem    because of Lupus   Sickle cell trait (HCC)    Systemic lupus erythematosus (HCC)    Trichomonas    Warts    HPV   Yeast infection     Past Surgical History:  Procedure Laterality Date   CHOLECYSTECTOMY      Current Medications: Current Meds  Medication Sig   amLODipine (NORVASC) 10 MG tablet Take 1 tablet (10 mg total) by mouth daily.   Cholecalciferol (VITAMIN D3) 50 MCG (2000 UT) capsule Take by mouth.   ferrous sulfate 325 (65 FE) MG tablet Take 325 mg by mouth daily with breakfast.   hydrochlorothiazide (HYDRODIURIL) 12.5 MG tablet Take 12.5 mg by mouth every morning.   hydroxychloroquine (PLAQUENIL) 200 MG tablet Take by mouth daily.   [DISCONTINUED] amLODipine (NORVASC) 5 MG tablet Take by mouth.     Allergies:   Ciprofloxacin   Social History   Socioeconomic History   Marital status: Divorced    Spouse name: Not on file   Number of children: Not on file   Years of education: Not on file   Highest education level: Not on file   Occupational History   Occupation: [employer]  Tobacco Use   Smoking status: Never   Smokeless tobacco: Never  Vaping Use   Vaping status: Never Used  Substance and Sexual Activity   Alcohol use: No   Drug use: No   Sexual activity: Yes    Partners: Male    Birth control/protection: None  Other Topics Concern   Not on file  Social History Narrative   Not on file   Social Determinants of Health   Financial Resource Strain: Not on file  Food Insecurity: Not on file  Transportation Needs: Not on file  Physical Activity: Not on file  Stress: Not on file  Social Connections: Unknown (06/11/2023)   Received from [facility]   Social Network    Social Network: Not on file     Family History: The patient's family history includes Asthma in her mother and son; Diabetes in her mother; Emphysema in her mother; Lupus in her maternal aunt and mother; Thyroid disease in her maternal grandmother. ROS:   Please see the history of present illness.    All 14 point review of systems negative except as described per history of present illness.  EKGs/Labs/Other Studies Reviewed:    The following studies were reviewed  today:   EKG:  EKG Interpretation Date/Time:  Wednesday July 08 2023 16:09:13 EDT Ventricular Rate:  85 PR Interval:  162 QRS Duration:  82 QT Interval:  376 QTC Calculation: 447 R Axis:   -8  Text Interpretation: Normal sinus rhythm with sinus arrhythmia Low voltage QRS Septal infarct , age undetermined Abnormal ECG Confirmed by Huntley Dec reddy [id number]) on 07/08/2023 4:17:30 PM    Recent Labs: No results found for requested labs within last 365 days.  Recent Lipid Panel No results found for: "CHOL", "TRIG", "HDL", "CHOLHDL", "VLDL", "LDLCALC", "LDLDIRECT"  Physical Exam:    VS:  BP (!) 140/98 (BP Location: Left Arm, Patient Position: Sitting, Cuff Size: Normal)   Pulse 85   Ht 5\' 8"  (1.727 m)   Wt 186 lb (84.4 kg)   SpO2 95%   BMI 28.28 kg/m     Wt  Readings from Last 3 Encounters:  07/08/23 186 lb (84.4 kg)  10/08/20 196 lb (88.9 kg)  06/22/19 190 lb 8 oz (86.4 kg)     GENERAL:  Well nourished, well developed in no acute distress NECK: No JVD; No carotid bruits CARDIAC: RRR, S1 and S2 present, no murmurs, no rubs, no gallops CHEST:  Clear to auscultation without rales, wheezing or rhonchi  Extremities: No pitting pedal edema. Pulses bilaterally symmetric with radial 2+ and dorsalis pedis 2+ NEUROLOGIC:  Alert and oriented x 3  Medication Adjustments/Labs and Tests Ordered: Current medicines are reviewed at length with the patient today.  Concerns regarding medicines are outlined above.  Orders Placed This Encounter  Procedures   EKG 12-Lead   Meds ordered this encounter  Medications   amLODipine (NORVASC) 10 MG tablet    Sig: Take 1 tablet (10 mg total) by mouth daily.    Dispense:  90 tablet    Refill:  3    Signed, Loranda Mastel reddy Shaylee Stanislawski, MD, MPH, [facility] 07/08/2023 5:12 PM    [facility]

## 2023-07-09 ENCOUNTER — Encounter: Payer: Self-pay | Admitting: *Deleted

## 2023-08-10 ENCOUNTER — Telehealth: Payer: Self-pay

## 2023-08-10 ENCOUNTER — Other Ambulatory Visit: Payer: Self-pay

## 2023-08-10 DIAGNOSIS — R921 Mammographic calcification found on diagnostic imaging of breast: Secondary | ICD-10-CM | POA: Insufficient documentation

## 2023-08-10 DIAGNOSIS — M259 Joint disorder, unspecified: Secondary | ICD-10-CM | POA: Insufficient documentation

## 2023-08-10 DIAGNOSIS — B009 Herpesviral infection, unspecified: Secondary | ICD-10-CM | POA: Insufficient documentation

## 2023-08-10 DIAGNOSIS — N760 Acute vaginitis: Secondary | ICD-10-CM | POA: Insufficient documentation

## 2023-08-10 DIAGNOSIS — B379 Candidiasis, unspecified: Secondary | ICD-10-CM | POA: Insufficient documentation

## 2023-08-10 DIAGNOSIS — B9689 Other specified bacterial agents as the cause of diseases classified elsewhere: Secondary | ICD-10-CM | POA: Insufficient documentation

## 2023-08-10 DIAGNOSIS — B079 Viral wart, unspecified: Secondary | ICD-10-CM | POA: Insufficient documentation

## 2023-08-10 HISTORY — DX: Mammographic calcification found on diagnostic imaging of breast: R92.1

## 2023-08-10 HISTORY — DX: Other specified bacterial agents as the cause of diseases classified elsewhere: B96.89

## 2023-08-10 NOTE — Telephone Encounter (Signed)
Pt c/o BP issue: STAT if pt c/o blurred vision, one-sided weakness or slurred speech  1. What are your last 5 BP readings?   127/96  (last night at 11:30 pm) 130/92 152/114 136/110 119/98   2. Are you having any other symptoms (ex. Dizziness, headache, blurred vision, passed out)?   Headache all day yesterday and a little dizzy  3. What is your BP issue?   Patient stated she woke up yesterday with pain in her left arm and noted her BP was 152/115.  Patient stated she went to [facility] and they will be forwarding test results to Dr. Vincent Gros.

## 2023-08-10 NOTE — Telephone Encounter (Signed)
Spoke with pt. Reviewed Dr. Madireddy's note with pt. She agreed to increase hydrochlorothiazide to 25mg  daily and she has a follow up appt in the morning. She had no further questions.

## 2023-08-11 ENCOUNTER — Ambulatory Visit: Payer: 59

## 2023-08-11 VITALS — BP 112/74 | HR 70 | Ht 68.0 in | Wt 188.8 lb

## 2023-08-11 DIAGNOSIS — I272 Pulmonary hypertension, unspecified: Secondary | ICD-10-CM | POA: Diagnosis not present

## 2023-08-11 DIAGNOSIS — I10 Essential (primary) hypertension: Secondary | ICD-10-CM | POA: Diagnosis not present

## 2023-08-11 DIAGNOSIS — R079 Chest pain, unspecified: Secondary | ICD-10-CM

## 2023-08-11 HISTORY — DX: Chest pain, unspecified: R07.9

## 2023-08-11 MED ORDER — METOPROLOL TARTRATE 50 MG PO TABS
ORAL_TABLET | ORAL | 0 refills | Status: DC
Start: 2023-08-11 — End: 2023-09-15

## 2023-08-11 MED ORDER — ASPIRIN 81 MG PO TBEC
81.0000 mg | DELAYED_RELEASE_TABLET | Freq: Every day | ORAL | 3 refills | Status: DC
Start: 2023-08-11 — End: 2023-09-16

## 2023-08-11 NOTE — Progress Notes (Signed)
Cardiology Office Note:    Date:  08/11/2023   ID:  [name], DOB March 10, 1966, MRN [id number]  PCP:  Collene Mares, PA  Cardiologist:  Marlyn Corporal Jay Kempe, MD    Referring MD: Collene Mares, [place]   No chief complaint on file. Chest pain and hypertension  History of Present Illness:    Charlotte Cummings is a 57 y.o. female here for follow-up, last office visit with Charlotte Cummings was July 08, 2023.  She has history of SLE, hypertension, prior cholecystectomy, couple episodes of syncope in the past year with unremarkable event monitor results and transthoracic echocardiogram showing normal biventricular function and grade 1 diastolic dysfunction and pulmonary hypertension was seen for evaluation of dyspnea.  She previously had follow-up with cardiologist Dr. Dorena Cookey at [facility], last visit January 24, 2020. She was initially evaluated for atypical chest pain and vague dyspnea symptoms, workup with echocardiogram from July 2020 reported normal biventricular function with LVEF 60 to 65%, no significant valvular abnormalities. Her symptoms were attributed to noncardiac causes and likely related to underlying Lupus.   From PCPs referral notes echocardiogram reportedly noted grade 1 diastolic dysfunction and pulmonary hypertension.  Will request full copy of these results. Zio patch was done May 26, 2023, per patient she activated device multiple times during the monitoring.  Per patient the results apparently came back normal.   I still do not have these results for my review  At last visit we titrated up medications for hypertension by going up on the dose of amlodipine to 10 mg once daily.  She recently had an ER visit on 08-09-2023, reviewed ER note.  She confirms this visit was for an episode of chest discomfort and left shoulder to arm pain that woke her up from sleep, was persistent and she went for the evaluation after noting blood pressures at home with elevated into 150s to  160s systolic. Elevated blood pressures with systolic in 160s and diastolic in 90s in the ER.  Troponin T x 2 negative  CMP, CBC unremarkable  EKG sinus rhythm heart rate in 50s  Chest x-ray unremarkable. Following the ER visit after reviewing the above results we  recommended titrating up the dose of hydrochlorothiazide to 25 mg once daily she has been taking this for the last couple days.  Here for the visit today she mentions she is feeling well.  No further recurrence of symptoms.  Her cough symptoms have resolved as she was recovering from COVID 90 mg back in August.  She reported mild symptoms of nausea on a daily basis couple weeks ago that have resolved and no symptoms over the past week.  Blood pressure in office today much improved.  No other significant changes.  Denies any palpitations, lightheadedness, dizziness or syncopal episodes.  Denies any significant pedal edema.  She has focal swelling of the lateral aspect of the ankle joint on the left side.  This is chronic.  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 [facility] at [facility]  Hospitalized for 1 wk 2004     Syncope and collapse 07/08/2023   Visit for routine gyn exam 06/22/2019   Vitamin D deficiency 03/01/2021   Warts    HPV   Yeast infection     Past Surgical History:  Procedure Laterality Date   CHOLECYSTECTOMY      Current Medications: Current Meds  Medication Sig   amLODipine (NORVASC)  10 MG tablet Take 1 tablet (10 mg total) by mouth daily.   aspirin EC 81 MG tablet Take 1 tablet (81 mg total) by mouth daily. Swallow whole.   Cholecalciferol (VITAMIN D3) 50 MCG (2000 UT) capsule Take 2,000 Units by mouth daily.   ferrous sulfate 325 (65 FE) MG tablet Take 325 mg by mouth daily with breakfast.   hydrochlorothiazide (HYDRODIURIL) 12.5 MG tablet Take 25 mg by mouth every morning.   hydroxychloroquine (PLAQUENIL) 200 MG tablet Take 400 mg by mouth daily.   metoprolol tartrate (LOPRESSOR) 50 MG tablet Take 50 mg the night before and take 50 mg Lopressor 2 hours prior to your CT scan for a heart rate greater than 55.     Allergies:   Ciprofloxacin   Social History   Socioeconomic History   Marital status: Divorced    Spouse name: Not on file   Number of children: Not on file   Years of education: Not on file   Highest education level: Not on file  Occupational History   Occupation: [employer]  Tobacco Use   Smoking status: Never   Smokeless tobacco: Never  Vaping Use   Vaping status: Never Used  Substance and Sexual Activity   Alcohol use: No   Drug use: No   Sexual activity: Yes    Partners: Male    Birth control/protection: None  Other Topics Concern   Not on file  Social History Narrative   Not on file   Social Determinants of Health   Financial Resource Strain: Not on file  Food Insecurity: Not on file  Transportation Needs: Not on file  Physical Activity: Not on file  Stress: Not on file  Social Connections: Unknown (06/11/2023)   [facility]   Social Network    Social Network: Not on file     Family History: The patient's family history includes Asthma in her mother and son; Diabetes in her mother; Emphysema in her mother; Lupus in her maternal aunt and mother; Thyroid disease in her maternal grandmother. ROS:   Please see the history of present illness.    All 14 point review of systems negative except as described per history of  present illness  EKGs/Labs/Other Studies Reviewed:         Recent Labs: No results found for requested labs within last 365 days.  Recent Lipid Panel No results found for: "CHOL", "TRIG", "HDL", "CHOLHDL", "VLDL", "LDLCALC", "LDLDIRECT"  Physical Exam:    VS:  BP 112/74   Pulse 70   Ht 5\' 8"  (1.727 m)   Wt 188 lb 12.8 oz (85.6 kg)   SpO2 97%   BMI 28.71 kg/m     Wt Readings from Last 3 Encounters:  08/11/23 188 lb 12.8 oz (85.6 kg)  07/08/23 186 lb (84.4 kg)  10/08/20 196 lb (88.9 kg)     GENERAL:  Well nourished, well developed in no acute distress NECK: No JVD; No carotid  bruits CARDIAC: RRR, S1 and S2 present, no murmurs, no rubs, no gallops CHEST:  Clear to auscultation without rales, wheezing or rhonchi  Extremities: No pitting pedal edema. Pulses bilaterally symmetric with radial 2+ and dorsalis pedis 2+ NEUROLOGIC:  Alert and oriented x 3   ASSESSMENT AND PLAN:   Ms. Crandell 57 year old woman with history of SLE [proteinuria, arthralgia, bilateral rash/pleurisy/Raynaud's phenomena], hypertension, cholecystectomy in 1998, recent COVID-19 infection in August 2024. Blood pressures have improved on follow-up. She continues to have infrequent episodes of chest pain with the most recent episode leading to ER visit on 08-09-2023 with either hypertensive urgency versus unstable angina versus noncardiac cause such as pleurisy.   Problem List Items Addressed This Visit     Essential hypertension    Much improved blood pressure control today. Readings optimal 112/74 mmHg. Continue amlodipine 10 mg once daily and hydrochlorothiazide 25 mg once daily.  Now that her systolic blood pressures are better controlled, will repeat transthoracic echocardiogram to assess pulmonary pressures.      Relevant Medications   aspirin EC 81 MG tablet   metoprolol tartrate (LOPRESSOR) 50 MG tablet   Pulmonary hypertension (HCC)    Now that her systemic blood pressures are better  controlled, will reassess her pulmonary pressures with transthoracic echocardiogram.      Relevant Medications   aspirin EC 81 MG tablet   metoprolol tartrate (LOPRESSOR) 50 MG tablet   Chest pain of uncertain etiology - Primary    Unstable angina versus hypertensive urgency versus noncardiac causes.  No recurrent symptoms in the last 2 days. Will further evaluate for any significant underlying coronary artery disease with CTA coronary angiogram. Metoprolol tartrate 50 mg on the night before and on the morning of the test to optimize heart rates.   Recommended her to start taking aspirin 81 mg once daily.  She has no known side effects or allergies to aspirin use in the past.        Relevant Medications   aspirin EC 81 MG tablet   metoprolol tartrate (LOPRESSOR) 50 MG tablet   Other Relevant Orders   CT CORONARY MORPH W/CTA COR W/SCORE W/CA W/CM &/OR WO/CM   ECHOCARDIOGRAM COMPLETE   Basic metabolic panel   Return to clinic in 3 months for follow-up.  Earlier if needed based on test results.  Medication Adjustments/Labs and Tests Ordered: Current medicines are reviewed at length with the patient today.  Concerns regarding medicines are outlined above.  Orders Placed This Encounter  Procedures   CT CORONARY MORPH W/CTA COR W/SCORE W/CA W/CM &/OR WO/CM   Basic metabolic panel   ECHOCARDIOGRAM COMPLETE   Medication changes:  Meds ordered this encounter  Medications   aspirin EC 81 MG tablet    Sig: Take 1 tablet (81 mg total) by mouth daily. Swallow whole.    Dispense:  90 tablet    Refill:  3   metoprolol tartrate (LOPRESSOR) 50 MG tablet    Sig: Take 50 mg the night before and take 50 mg Lopressor 2 hours prior to your CT scan for a heart rate greater than 55.    Dispense:  2 tablet    Refill:  0    Signed, Vitaly Wanat reddy Kassandra Meriweather, MD, MPH, [facility] 08/11/2023 2:39 PM    [facility]

## 2023-08-11 NOTE — Patient Instructions (Signed)
Medication Instructions:  Your physician has recommended you make the following change in your medication:   Start taking 81 mg coated aspirin daily.  *If you need a refill on your cardiac medications before your next appointment, please call your pharmacy*   Lab Work: Your physician recommends that you have a BMP today in the office.   If you have labs (blood work) drawn today and your tests are completely normal, you will receive your results only by: MyChart Message (if you have MyChart) OR A paper copy in the mail If you have any lab test that is abnormal or we need to change your treatment, we will call you to review the results.   Testing/Procedures:   Your cardiac CT will be scheduled at one of the below locations:   [facility] [address] [place], [place] [address] [phone]  If scheduled at [facility], please arrive at the [facility] and Children's Entrance (Entrance C2) of [facility] 30 minutes prior to test start time. You can use the FREE valet parking offered at entrance C (encouraged to control the heart rate for the test)  Proceed to the [facility] Radiology Department (first floor) to check-in and test prep.  All radiology patients and guests should use entrance C2 at [facility], accessed from [facility], even though the hospital's physical address listed is [address]     Please follow these instructions carefully (unless otherwise directed):  On the Night Before the Test: Be sure to Drink plenty of water. Do not consume any caffeinated/decaffeinated beverages or chocolate 12 hours prior to your test. Do not take any antihistamines 12 hours prior to your test.  On the Day of the Test: Drink plenty of water until 1 hour prior to the test. Do not eat any food 1 hour prior to test. You may take your regular medications prior to the test.  Take metoprolol (Lopressor) two hours prior to  test. Take 50 mg the night before and 50 mg 2 hours prior to the scan. This is only for the scan. FEMALES- please wear underwire-free bra if available, avoid dresses & tight clothing      After the Test: Drink plenty of water. After receiving IV contrast, you may experience a mild flushed feeling. This is normal. On occasion, you may experience a mild rash up to 24 hours after the test. This is not dangerous. If this occurs, you can take Benadryl 25 mg and increase your fluid intake. If you experience trouble breathing, this can be serious. If it is severe call 911 IMMEDIATELY. If it is mild, please call our office. If you take any of these medications: Glipizide/Metformin, Avandament, Glucavance, please do not take 48 hours after completing test unless otherwise instructed.  We will call to schedule your test 2-4 weeks out understanding that some insurance companies will need an authorization prior to the service being performed.   For non-scheduling related questions, please contact the cardiac imaging nurse navigator should you have any questions/concerns: Rockwell Alexandria, Cardiac Imaging Nurse Navigator Larey Brick, Cardiac Imaging Nurse Navigator [facility] Direct Office Dial: [phone]   For scheduling needs, including cancellations and rescheduling, please call [place], [phone].   Your physician has requested that you have an echocardiogram. Echocardiography is a painless test that uses sound waves to create images of your heart. It provides your doctor with information about the size and shape of your heart and  how well your heart's chambers and valves are working. This procedure takes approximately one hour. There are no restrictions for this procedure. Please do NOT wear cologne, perfume, aftershave, or lotions (deodorant is allowed). Please arrive 15 minutes prior to your appointment time.  Your next appointment:   3 month(s)  The format for  your next appointment:   In Person  Provider:   Huntley Dec, MD   Other Instructions Cardiac CT Angiogram A cardiac CT angiogram is a procedure to look at the heart and the area around the heart. It may be done to help find the cause of chest pains or other symptoms of heart disease. During this procedure, a substance called contrast dye is injected into the blood vessels in the area to be checked. A large X-ray machine, called a CT scanner, then takes detailed pictures of the heart and the surrounding area. The procedure is also sometimes called a coronary CT angiogram, coronary artery scanning, or CTA. A cardiac CT angiogram allows the health care provider to see how well blood is flowing to and from the heart. The health care provider will be able to see if there are any problems, such as: Blockage or narrowing of the coronary arteries in the heart. Fluid around the heart. Signs of weakness or disease in the muscles, valves, and tissues of the heart. Tell a health care provider about: Any allergies you have. This is especially important if you have had a previous allergic reaction to contrast dye. All medicines you are taking, including vitamins, herbs, eye drops, creams, and over-the-counter medicines. Any blood disorders you have. Any surgeries you have had. Any medical conditions you have. Whether you are pregnant or may be pregnant. Any anxiety disorders, chronic pain, or other conditions you have that may increase your stress or prevent you from lying still. What are the risks? Generally, this is a safe procedure. However, problems may occur, including: Bleeding. Infection. Allergic reactions to medicines or dyes. Damage to other structures or organs. Kidney damage from the contrast dye that is used. Increased risk of cancer from radiation exposure. This risk is low. Talk with your health care provider about: The risks and benefits of testing. How you can receive the  lowest dose of radiation. What happens before the procedure? Wear comfortable clothing and remove any jewelry, glasses, dentures, and hearing aids. Follow instructions from your health care provider about eating and drinking. This may include: For 12 hours before the procedure -- avoid caffeine. This includes tea, coffee, soda, energy drinks, and diet pills. Drink plenty of water or other fluids that do not have caffeine in them. Being well hydrated can prevent complications. For 4-6 hours before the procedure -- stop eating and drinking. The contrast dye can cause nausea, but this is less likely if your stomach is empty. Ask your health care provider about changing or stopping your regular medicines. This is especially important if you are taking diabetes medicines, blood thinners, or medicines to treat problems with erections (erectile dysfunction). What happens during the procedure?  Hair on your chest may need to be removed so that small sticky patches called electrodes can be placed on your chest. These will transmit information that helps to monitor your heart during the procedure. An IV will be inserted into one of your veins. You might be given a medicine to control your heart rate during the procedure. This will help to ensure that good images are obtained. You will be asked to lie on an  exam table. This table will slide in and out of the CT machine during the procedure. Contrast dye will be injected into the IV. You might feel warm, or you may get a metallic taste in your mouth. You will be given a medicine called nitroglycerin. This will relax or dilate the arteries in your heart. The table that you are lying on will move into the CT machine tunnel for the scan. The person running the machine will give you instructions while the scans are being done. You may be asked to: Keep your arms above your head. Hold your breath. Stay very still, even if the table is moving. When the scanning is  complete, you will be moved out of the machine. The IV will be removed. The procedure may vary among health care providers and hospitals. What can I expect after the procedure? After your procedure, it is common to have: A metallic taste in your mouth from the contrast dye. A feeling of warmth. A headache from the nitroglycerin. Follow these instructions at home: Take over-the-counter and prescription medicines only as told by your health care provider. If you are told, drink enough fluid to keep your urine pale yellow. This will help to flush the contrast dye out of your body. Most people can return to their normal activities right after the procedure. Ask your health care provider what activities are safe for you. It is up to you to get the results of your procedure. Ask your health care provider, or the department that is doing the procedure, when your results will be ready. Keep all follow-up visits as told by your health care provider. This is important. Contact a health care provider if: You have any symptoms of allergy to the contrast dye. These include: Shortness of breath. Rash or hives. A racing heartbeat. Summary A cardiac CT angiogram is a procedure to look at the heart and the area around the heart. It may be done to help find the cause of chest pains or other symptoms of heart disease. During this procedure, a large X-ray machine, called a CT scanner, takes detailed pictures of the heart and the surrounding area after a contrast dye has been injected into blood vessels in the area. Ask your health care provider about changing or stopping your regular medicines before the procedure. This is especially important if you are taking diabetes medicines, blood thinners, or medicines to treat erectile dysfunction. If you are told, drink enough fluid to keep your urine pale yellow. This will help to flush the contrast dye out of your body. This information is not intended to replace  advice given to you by your health care provider. Make sure you discuss any questions you have with your health care provider. Document Revised: 06/22/2019 Document Reviewed: 06/22/2019 Elsevier Patient Education  [employer].  Echocardiogram An echocardiogram is a test that uses sound waves to make images of your heart. This way of making images is often called ultrasound. The images from this test can help find out many things about your heart, including: The size and shape of your heart. The strength of your heart muscle and how well it's working. The size, thickness, and movement of your heart's walls. How your heart valves are working. Problems such as: A tumor or a growth from an infection around the heart valves. Areas of heart muscle that aren't working well because of poor blood flow or injury from a heart attack. An aneurysm. This is a weak or damaged  part of an artery wall. An artery is a blood vessel. Tell a health care provider about: Any allergies you have. All medicines you're taking, including vitamins, herbs, eye drops, creams, and over-the-counter medicines. Any bleeding problems you have. Any surgeries you've had. Any medical problems you have. Whether you're pregnant or may be pregnant. What are the risks? Your health care provider will talk with you about risks. These may include an allergic reaction to IV dye that may be used during the test. What happens before the test? You don't need to do anything to get ready for this test. You may eat and drink normally. What happens during the test?  You'll take off your clothes from the waist up and put on a hospital gown. Sticky patches called electrodes may be placed on your chest. These will be connected to a machine that monitors your heart rate and rhythm. You'll lie down on a table for the exam. A wand covered in gel will be moved over your chest. Sound waves from the wand will go to your heart and bounce  back--or "echo" back. The sound waves will go to a computer that uses them to make images of your heart. The images can be viewed on a monitor. The images will also be recorded on the computer so your provider can look at them later. You may be asked to change positions or hold your breath for a short time. This makes it easier to get different views or better views of your heart. In some cases, you may be given a dye through an IV. The IV is put into one of your veins. This dye can make the areas of your heart easier to see. The procedure may vary among providers and hospitals. What can I expect after the test? You may return to your normal diet, activities, and medicines unless your provider tells you not to. If an IV was placed for the test, it will be removed. It's up to you to get the results of your test. Ask your provider, or the department that's doing the test, when your results will be ready. This information is not intended to replace advice given to you by your health care provider. Make sure you discuss any questions you have with your health care provider. Document Revised: 12/26/2022 Document Reviewed: 12/26/2022 Elsevier Patient Education  2024 [employer].

## 2023-08-11 NOTE — Assessment & Plan Note (Signed)
Much improved blood pressure control today. Readings optimal 112/74 mmHg. Continue amlodipine 10 mg once daily and hydrochlorothiazide 25 mg once daily.  Now that her systolic blood pressures are better controlled, will repeat transthoracic echocardiogram to assess pulmonary pressures.

## 2023-08-11 NOTE — Assessment & Plan Note (Signed)
Now that her systemic blood pressures are better controlled, will reassess her pulmonary pressures with transthoracic echocardiogram.

## 2023-08-11 NOTE — Assessment & Plan Note (Signed)
Unstable angina versus hypertensive urgency versus noncardiac causes.  No recurrent symptoms in the last 2 days. Will further evaluate for any significant underlying coronary artery disease with CTA coronary angiogram. Metoprolol tartrate 50 mg on the night before and on the morning of the test to optimize heart rates.   Recommended her to start taking aspirin 81 mg once daily.  She has no known side effects or allergies to aspirin use in the past.

## 2023-08-12 LAB — BASIC METABOLIC PANEL
BUN/Creatinine Ratio: 14 (ref 9–23)
BUN: 14 mg/dL (ref 6–24)
CO2: 27 mmol/L (ref 20–29)
Calcium: 9.8 mg/dL (ref 8.7–10.2)
Chloride: 100 mmol/L (ref 96–106)
Creatinine, Ser: 1 mg/dL (ref 0.57–1.00)
Glucose: 69 mg/dL — ABNORMAL LOW (ref 70–99)
Potassium: 3.7 mmol/L (ref 3.5–5.2)
Sodium: 142 mmol/L (ref 134–144)
eGFR: 67 mL/min/{1.73_m2} (ref >59)

## 2023-08-13 ENCOUNTER — Telehealth: Payer: Self-pay

## 2023-08-13 NOTE — Telephone Encounter (Signed)
Left a vm to discuss FMLA paperwork

## 2023-08-13 NOTE — Telephone Encounter (Signed)
Pt states that the [facility] paperwork is for intermittent leave ie appt./testing.

## 2023-08-14 MED ORDER — HYDROCHLOROTHIAZIDE 25 MG PO TABS
25.0000 mg | ORAL_TABLET | Freq: Every day | ORAL | 3 refills | Status: DC
Start: 2023-08-14 — End: 2024-03-30

## 2023-08-14 NOTE — Addendum Note (Signed)
Addended by: Baldo Ash D on: 08/14/2023 12:42 PM   Modules accepted: Orders

## 2023-08-18 ENCOUNTER — Encounter (HOSPITAL_COMMUNITY): Payer: Self-pay

## 2023-08-19 ENCOUNTER — Ambulatory Visit: Payer: 59

## 2023-08-20 ENCOUNTER — Ambulatory Visit (HOSPITAL_COMMUNITY)
Admission: RE | Admit: 2023-08-20 | Discharge: 2023-08-20 | Disposition: A | Discharge status: Home / Self Care | Payer: 59 | Source: Ambulatory Visit

## 2023-08-20 DIAGNOSIS — R072 Precordial pain: Secondary | ICD-10-CM | POA: Diagnosis not present

## 2023-08-20 DIAGNOSIS — R079 Chest pain, unspecified: Secondary | ICD-10-CM | POA: Diagnosis present

## 2023-08-20 MED ORDER — IOHEXOL 350 MG/ML SOLN
95.0000 mL | Freq: Once | INTRAVENOUS | Status: AC | PRN
  Administered 2023-08-20: 95 mL via INTRAVENOUS

## 2023-08-20 MED ORDER — NITROGLYCERIN 0.4 MG SL SUBL
0.8000 mg | SUBLINGUAL_TABLET | Freq: Once | SUBLINGUAL | Status: AC
  Administered 2023-08-20: 0.8 mg via SUBLINGUAL

## 2023-08-20 MED ORDER — NITROGLYCERIN 0.4 MG SL SUBL
SUBLINGUAL_TABLET | SUBLINGUAL | Status: AC
  Filled 2023-08-20: qty 2

## 2023-09-15 ENCOUNTER — Other Ambulatory Visit: Payer: Self-pay

## 2023-09-16 ENCOUNTER — Ambulatory Visit: Payer: 59

## 2023-09-16 VITALS — BP 134/85 | HR 68 | Ht 68.0 in | Wt 184.0 lb

## 2023-09-16 DIAGNOSIS — I272 Pulmonary hypertension, unspecified: Secondary | ICD-10-CM

## 2023-09-16 DIAGNOSIS — I493 Ventricular premature depolarization: Secondary | ICD-10-CM | POA: Insufficient documentation

## 2023-09-16 DIAGNOSIS — G901 Familial dysautonomia [Riley-Day]: Secondary | ICD-10-CM

## 2023-09-16 DIAGNOSIS — I10 Essential (primary) hypertension: Secondary | ICD-10-CM

## 2023-09-16 HISTORY — DX: Ventricular premature depolarization: I49.3

## 2023-09-16 HISTORY — DX: Familial dysautonomia (riley-day): G90.1

## 2023-09-16 LAB — ECHOCARDIOGRAM COMPLETE
Area-P 1/2: 3.13 cm2
S' Lateral: 2.9 cm

## 2023-09-16 MED ORDER — METOPROLOL TARTRATE 25 MG PO TABS: 25.0000 mg | ORAL_TABLET | Freq: Every day | ORAL | 3 refills | Status: DC | PRN

## 2023-09-16 NOTE — Assessment & Plan Note (Signed)
Ventricular ectopy isolated noted during echocardiogram today was asymptomatic. No cardiac structural and functional issues.  No CAD. At this time no further specific treatment.  If symptomatic will obtain a repeat heart monitor to assess burden of PVCs and start scheduled regimen of beta-blockers if needed.

## 2023-09-16 NOTE — Assessment & Plan Note (Signed)
Well-controlled. Continue target below 130 over 80 mmHg. Continue amlodipine 10 mg once daily and hydrochlorothiazide 25 mg once daily.  Reviewed how amlodipine can cause reflex tachycardia. Offered switching to beta-blocker such as carvedilol.  Dose with blood pressure control and also avoid reflex tachycardia. She would like to hold off at this time. Her heart rates at baseline appear to be in 60s, hence I think holding off on initiating beta-blockers at this time is reasonable. Will prescribe metoprolol tartrate to be used on an as-needed basis if her symptoms of elevated heart rates are occurring on a frequent basis.

## 2023-09-16 NOTE — Assessment & Plan Note (Signed)
Feels her symptoms are likely related to dysautonomia. Options for heart monitor versus tilt table discussed. Management however would not significantly wary at this time other than conservative measures.  POTS (postural orthostatic tachycardia syndrome) Reviewed tendency for increased heart rate [more than 30 bpm on baseline or >120b/min within first 10 minutes of standing],  associated with symptoms.  This is usually not associated with drop in blood pressure.  Reviewed how biologically there could be a tendency for increased adrenergic like substances in the blood causing increased sympathetic activation resulting in these changes and symptoms. People with underlyin joint hypermobility, autoimmune disorders, autonomic neuropathy are predisposed to this. Reviewed various exacerbating factors such as dehydration, eating, activities that require prolonged standing.   Rule out anemia, iron deficiency, Thyroid panel.  TTE to rule out cardiac structure and functional abnormalities.   Based on workup:   In their case Hypovolemic vs Neuropathic/Dysautonomia vs Hyperadrenergic/betahypersensitivity vs Deconditioned vs Immune related (Mast cell activation).  Nonpharmacological measures to help alleviate the symptoms were discussed. Suggested to avoid aggravating factors, avoid alcohol and excess caffeine Increased salt and water intake, use of compression stockings reviewed.  Contracting lower extremity muscles at frequent intervals was suggested Options to use compression stockings reviewed. Exercise, focusing on aerobic with lower extremity strengthening was recommended, if possible up to 6 times a week.  More favorable to do exercises in supine position such as rowing, recumbent cycling and monitor settings swimming.  Pharmacological therapy with Beta-blockers reviewed.  However given her baseline slow heart rates, will hold off scheduled regimen. Prescribing metoprolol tartrate 25 mg to be used  as needed for any significant increase in symptoms of palpitations and instructed to notify us if she has to start doing this.

## 2023-09-16 NOTE — Progress Notes (Signed)
Cardiology Consultation:    Date:  09/16/2023   ID:  [name], DOB [birth year], MRN [id number]  PCP:  Collene Mares, PA  Cardiologist:  Marlyn Corporal Rasean Joos, MD   Referring MD: Collene Mares, [place]   Chief Complaint  Patient presents with   Follow-up     ASSESSMENT AND PLAN:   Ms. Charlotte Cummings 57/f with with history of SLE, hypertension, cholecystectomy in 1998, recent COVID-19 infection in August 2024, syncope around the time of COVID infection, further workup with Zio patch reportedly unremarkable, echocardiogram with mild concern for pulmonary hypertension, atypical chest pain workup with CT coronary angiogram showed no plaque buildup, now with concerns about elevated heart rate with minimal activity and was wondering if this is POTS.  Echocardiogram repeated today in the office showed normal biventricular function and no evidence of pulmonary hypertension.  Occasional ventricular ectopy in a bigeminy pattern noted during the echocardiogram study.    Problem List Items Addressed This Visit     Essential hypertension - Primary    Well-controlled. Continue target below 130 over 80 mmHg. Continue amlodipine 10 mg once daily and hydrochlorothiazide 25 mg once daily.  Reviewed how amlodipine can cause reflex tachycardia. Offered switching to beta-blocker such as carvedilol.  Dose with blood pressure control and also avoid reflex tachycardia. She would like to hold off at this time. Her heart rates at baseline appear to be in 60s, hence I think holding off on initiating beta-blockers at this time is reasonable. Will prescribe metoprolol tartrate to be used on an as-needed basis if her symptoms of elevated heart rates are occurring on a frequent basis.      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 No cardiac structural and functional issues.  No CAD. At this time no further specific treatment.  If symptomatic will obtain a repeat heart monitor to assess burden of PVCs and start scheduled regimen of beta-blockers if needed.      Relevant Medications   metoprolol tartrate (LOPRESSOR) 25 MG tablet   Dysautonomia (HCC)    Feels her symptoms are likely related to dysautonomia. Options for heart monitor versus tilt table discussed. Management however would not significantly wary at this time other than conservative measures.  POTS (postural orthostatic tachycardia syndrome) Reviewed tendency for increased heart rate [more than 30 bpm on baseline or >120b/min within first 10 minutes of standing],  associated with symptoms.  This is usually not associated with drop in blood pressure.  Reviewed how biologically there could be a tendency for increased adrenergic like substances in the blood causing increased sympathetic activation resulting in these changes and symptoms. People with underlyin joint hypermobility, autoimmune disorders, autonomic neuropathy are predisposed to this. Reviewed various exacerbating factors such as dehydration, eating, activities that require prolonged standing.   Rule out anemia, iron deficiency, Thyroid panel.  TTE to rule out cardiac structure and functional abnormalities.   Based on workup:   In their case Hypovolemic vs Neuropathic/Dysautonomia vs Hyperadrenergic/betahypersensitivity vs Deconditioned vs Immune related (Mast cell activation).  Nonpharmacological measures to help alleviate the symptoms were discussed. Suggested to avoid aggravating factors, avoid alcohol and excess caffeine Increased salt and water intake, use of compression stockings reviewed.  Contracting lower extremity muscles at frequent intervals was suggested Options to use compression stockings reviewed. Exercise, focusing on aerobic with lower  extremity strengthening was recommended, if possible up to 6 times a week.  More favorable to do exercises in supine position such as rowing, recumbent cycling and monitor settings swimming.  Pharmacological therapy with Beta-blockers reviewed.  However given her baseline slow heart rates, will hold off scheduled regimen. Prescribing metoprolol tartrate 25 mg to be used as needed for any significant increase in symptoms of palpitations and instructed to notify us if she has to start doing this.       Time over 40 minutes spent in reviewing the chart, interpreting the echocardiogram images and reviewing the results.   Follow-up with PCP for any further assessment for thyroid dysfunction. Return to clinic for follow-up in 6 months.    History of Present Illness:    Charlotte Cummings is a 57 y.o. female who is being seen today for follow up visit.  Initially seen for consult 07/08/2023, last visit with me was 08/11/2023. PCP is [place], [place], [place].   Has history of SLE [with proteinuria, arthralgia, bilateral rash/pleurisy/Raynaud's phenomena], hypertension, cholecystectomy 1998, recent COVID-19 infection in August 2024.  Around that time she had a syncopal episode while walking up a flight of stairs at home.  Workup done by PCP with echocardiogram and Zio patch.  Per patient Zio patch was uneventful despite her having multiple symptoms and reporting them on the Holter diary.  Pulmonary hypertension reported on prior echocardiogram per PCPs office notes.  Her blood pressure was noted to be suboptimally controlled.  We started hydrochlorothiazide and titrated up her amlodipine dose. Subsequently with episodes of atypical chest pain, further evaluated for coronary artery disease with CTA coronary angiogram 08/24/2023 that showed normal findings with calcium score 0, CAD RADS 0 study and no significant extracardiac findings suggestive of noncardiac etiology of her chest pain likely pleurisy in the  setting of SLE.    Further follow-up echocardiogram done to assess for pulmonary hypertension was pending and performed today.  Here today for follow-up by herself. Mentions she feels her heart rate speeds up at times with minimal activity such as changing position or walking.  She feels her symptoms could be related to POTS syndrome.  She denies any symptoms of chest pain, orthopnea or paroxysmal nocturnal dyspnea.  Blood pressure readings at home have significantly improved over the last 2 to 3 weeks readings ranging from systolic 100-120's.  She does walk regularly at work after lunch.  Echocardiogram images from today reviewed shows normal biventricular size and function, no significant valve abnormalities.  Normal diastolic function.  Rhythm at times shows isolated ventricular ectopy happening in a bigeminy pattern.  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 at [facility]  Hospitalized for 1 wk 2004     Syncope and collapse 07/08/2023   Visit for routine gyn exam 06/22/2019   Vitamin D deficiency 03/01/2021  Warts    HPV   Yeast infection     Past Surgical History:  Procedure Laterality Date   CHOLECYSTECTOMY      Current Medications: Current Meds  Medication Sig   amLODipine (NORVASC) 10 MG tablet Take 1 tablet (10 mg total) by mouth daily.   Cholecalciferol (VITAMIN D3) 50 MCG (2000 UT) capsule  Take 2,000 Units by mouth daily.   ferrous sulfate 325 (65 FE) MG tablet Take 325 mg by mouth daily with breakfast.   hydrochlorothiazide (HYDRODIURIL) 25 MG tablet Take 1 tablet (25 mg total) by mouth daily.   hydroxychloroquine (PLAQUENIL) 200 MG tablet Take 400 mg by mouth daily.   metoprolol tartrate (LOPRESSOR) 25 MG tablet Take 1 tablet (25 mg total) by mouth daily as needed (palpitations and rapid heart rate).     Allergies:   Ciprofloxacin   Social History   Socioeconomic History   Marital status: Divorced    Spouse name: Not on file   Number of children: Not on file   Years of education: Not on file   Highest education level: Not on file  Occupational History   Occupation: [employer]  Tobacco Use   Smoking status: Never   Smokeless tobacco: Never  Vaping Use   Vaping status: Never Used  Substance and Sexual Activity   Alcohol use: No   Drug use: No   Sexual activity: Yes    Partners: Male    Birth control/protection: None  Other Topics Concern   Not on file  Social History Narrative   Not on file   Social Determinants of Health   Financial Resource Strain: Not on file  Food Insecurity: Not on file  Transportation Needs: Not on file  Physical Activity: Not on file  Stress: Not on file  Social Connections: Unknown (06/11/2023)   [facility]   Social Network    Social Network: Not on file     Family History: The patient's family history includes Asthma in her mother and son; Diabetes in her mother; Emphysema in her mother; Lupus in her maternal aunt and mother; Thyroid disease in her maternal grandmother. ROS:   Please see the history of present illness.    All 14 point review of systems negative except as described per history of present illness.  EKGs/Labs/Other Studies Reviewed:    The following studies were reviewed today:   EKG:       Recent Labs: 08/11/2023: BUN 14; Creatinine, Ser 1.00; Potassium 3.7; Sodium 142  Recent Lipid  Panel No results found for: "CHOL", "TRIG", "HDL", "CHOLHDL", "VLDL", "LDLCALC", "LDLDIRECT"  Physical Exam:    VS:  BP 134/85 (BP Location: Left Arm, Patient Position: Sitting, Cuff Size: Normal)   Pulse 68   Ht 5\' 8"  (1.727 m)   Wt 184 lb (83.5 kg)   SpO2 97%   BMI 27.98 kg/m     Wt Readings from Last 3 Encounters:  09/16/23 184 lb (83.5 kg)  08/11/23 188 lb 12.8 oz (85.6 kg)  07/08/23 186 lb (84.4 kg)     GENERAL:  Well nourished, well developed in no acute distress NECK: No JVD; No carotid bruits CARDIAC: RRR, S1 and S2 present, no murmurs, no rubs, no gallops Extremities: Pulses bilaterally symmetric with radial 2+ NEUROLOGIC:  Alert and oriented x 3  Medication Adjustments/Labs and Tests Ordered: Current medicines are reviewed at length with the patient today.  Concerns regarding medicines are outlined above.  No orders of the defined types were placed  in this encounter.  Meds ordered this encounter  Medications   metoprolol tartrate (LOPRESSOR) 25 MG tablet    Sig: Take 1 tablet (25 mg total) by mouth daily as needed (palpitations and rapid heart rate).    Dispense:  90 tablet    Refill:  3    Signed, Kendale Rembold reddy Jerrell Hart, MD, MPH, [facility]. 09/16/2023 2:26 PM    [facility]

## 2023-09-16 NOTE — Patient Instructions (Signed)
Medication Instructions:  Your physician has recommended you make the following change in your medication:  Start Metoprolol Tartrate 25 mg once daily as needed for palpitations and rapid heart rate  *If you need a refill on your cardiac medications before your next appointment, please call your pharmacy*   Lab Work: NONE If you have labs (blood work) drawn today and your tests are completely normal, you will receive your results only by: MyChart Message (if you have MyChart) OR A paper copy in the mail If you have any lab test that is abnormal or we need to change your treatment, we will call you to review the results.   Testing/Procedures: NONE   Follow-Up: At [facility], you and your health needs are our priority.  As part of our continuing mission to provide you with exceptional heart care, we have created designated Provider Care Teams.  These Care Teams include your primary Cardiologist (physician) and Advanced Practice Providers (APPs -  Physician Assistants and Nurse Practitioners) who all work together to provide you with the care you need, when you need it.  We recommend signing up for the patient portal called "MyChart".  Sign up information is provided on this After Visit Summary.  MyChart is used to connect with patients for Virtual Visits (Telemedicine).  Patients are able to view lab/test results, encounter notes, upcoming appointments, etc.  Non-urgent messages can be sent to your provider as well.   To learn more about what you can do with MyChart, go to [email or web address].    Your next appointment:   6 month(s)  Provider:   Huntley Dec, MD    Other Instructions

## 2024-03-30 ENCOUNTER — Other Ambulatory Visit: Payer: Self-pay

## 2024-03-31 ENCOUNTER — Ambulatory Visit

## 2024-03-31 VITALS — BP 120/80 | HR 60 | Ht 68.0 in | Wt 195.0 lb

## 2024-03-31 DIAGNOSIS — G901 Familial dysautonomia [Riley-Day]: Secondary | ICD-10-CM

## 2024-03-31 DIAGNOSIS — I10 Essential (primary) hypertension: Secondary | ICD-10-CM

## 2024-03-31 NOTE — Patient Instructions (Signed)

## 2024-03-31 NOTE — Assessment & Plan Note (Signed)
 Well-controlled over the past 6 months. Exercising regularly. Continues to keep herself well-hydrated. Continue with her current medication for blood pressure control as reviewed above. She has used metoprolol  on couple occasions in the last 6 months for symptoms of elevated heartbeat sensation throughout the day with good response.  Encouraged her to keep active with supine or recumbent exercises.

## 2024-03-31 NOTE — Progress Notes (Signed)
 Cardiology Consultation:    Date:  03/31/2024   ID:  Charlotte Cummings, DOB Feb 18, 1966, MRN [id number]  PCP:  Charlotte Cummings, [place]  E, PA  Cardiologist:  Daymon Evans Dariyah Garduno, MD   Referring MD: Emelda Hane, PA   Chief Complaint  Patient presents with   Follow-up     ASSESSMENT AND PLAN:   Ms. Charlotte Cummings 58 year old woman with history of history of SLE [with proteinuria, arthralgia, bilateral rash/pleurisy/Raynaud's phenomena], hypertension, cholecystectomy in 1998, recent COVID-19 infection in August 2024, syncope around the time of COVID infection, further workup with Zio patch reportedly unremarkable, echocardiogram with mild concern for pulmonary hypertension, atypical chest pain workup with CT coronary angiogram October 2024 showed no plaque buildup, now with concerns about elevated heart rate with minimal activity and was wondering if this is POTS.  Echocardiogram repeated 1106/2024 showed normal biventricular function and no evidence of pulmonary hypertension.  Occasional ventricular ectopy in a bigeminy pattern noted during the echocardiogram study was asymptomatic.    Problem List Items Addressed This Visit     Essential hypertension - Primary   Well-controlled. Target below 130/80 mmHg. Continue amlodipine  10 mg once daily Hydrochlorothiazide  25 mg once daily.        Dysautonomia (HCC)   Well-controlled over the past 6 months. Exercising regularly. Continues to keep herself well-hydrated. Continue with her current medication for blood pressure control as reviewed above. She has used metoprolol  on couple occasions in the last 6 months for symptoms of elevated heartbeat sensation throughout the day with good response.  Encouraged her to keep active with supine or recumbent exercises.        Follow-up in the clinic in 1 year  or on as needed basis.  History of Present Illness:    Charlotte Cummings is a 58 y.o. female who is being seen today for follow-up visit. PCP  is Charlotte Cummings, [place]  E, PA.  Last visit with him in the office was 09/16/2023.  history of SLE [with proteinuria, arthralgia, bilateral rash/pleurisy/Raynaud's phenomena], hypertension, cholecystectomy in 1998, recent COVID-19 infection in August 2024, syncope around the time of COVID infection, further workup with Zio patch reportedly unremarkable, echocardiogram with mild concern for pulmonary hypertension, atypical chest pain workup with CT coronary angiogram October 2024 showed no plaque buildup, now with concerns about elevated heart rate with minimal activity and was wondering if this is POTS.  Echocardiogram repeated 1106/2024 showed normal biventricular function and no evidence of pulmonary hypertension.  Occasional ventricular ectopy in a bigeminy pattern noted during the echocardiogram study was asymptomatic.  Pleasant woman here for the visit by herself. Continues to remain active at work. Using a walking pad to walk indoors at home now that the weather is leading up which can flareup her lupus.  She recently lost her grandmother and had been under stress with organizing over the last few weeks.  From cardiac standpoint denies any lightheadedness, chest pain, dizziness syncopal episodes.  She did experience couple episodes of palpitations lasting for an extended duration and took metoprolol  on 2 separate occasions in the past 6 months.  Otherwise tolerating the medications well.  Blood pressures have been well-controlled.    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   Cholecalciferol (VITAMIN D3) 50 MCG (2000 UT) capsule Take 2,000 Units by mouth daily.   citalopram (CELEXA) 10 MG tablet Take 10 mg by mouth daily.   hydrochlorothiazide  (HYDRODIURIL ) 25 MG tablet Take 25 mg by mouth daily.   hydroxychloroquine (PLAQUENIL) 200 MG tablet Take 400 mg by mouth daily.   metoprolol  tartrate (LOPRESSOR ) 25 MG tablet Take 25 mg by mouth daily as needed (palpitations and rapid heart rate).     Allergies:   Ciprofloxacin   Social History   Socioeconomic History   Marital status: Divorced    Spouse name: Not on file   Number of children: Not on file   Years of education: Not on file   Highest education level: Not on file  Occupational History   Occupation: [employer]  Tobacco Use   Smoking status: Never   Smokeless tobacco: Never  Vaping Use   Vaping status: Never Used  Substance and Sexual Activity   Alcohol use: No   Drug use: No   Sexual activity: Yes    Partners: Male    Birth control/protection: None  Other Topics Concern   Not on file  Social History Narrative   Not on file   Social Drivers of Health   Financial Resource Strain: Not on file  Food Insecurity: Low Risk  (12/07/2023)   Received from [facility]   Hunger Vital Sign    Worried About Running Out of Food  in the Last Year: Never true    Ran Out of Food in the Last Year: Never true  Transportation Needs: No Transportation Needs (12/07/2023)   Received from [facility]    In the past 12 months, has lack of reliable transportation kept you from medical appointments, meetings, work or from getting things needed for daily living? : No  Physical Activity: Not on file  Stress: Not on file  Social Connections: Unknown (06/11/2023)   Received from [employer]   Social Network    Social Network: Not on file     Family History: The patient's family history includes Asthma in her mother and son; Diabetes in her mother; Emphysema in her mother; Lupus in her maternal aunt and mother; Thyroid disease in her maternal grandmother. ROS:   Please see the history of present illness.    All 14 point review of systems negative except as described per history of present illness.  EKGs/Labs/Other Studies Reviewed:    The following studies were reviewed today:   EKG:       Recent Labs: 08/11/2023:  BUN 14; Creatinine, Ser 1.00; Potassium 3.7; Sodium 142  Recent Lipid Panel No results found for: "CHOL", "TRIG", "HDL", "CHOLHDL", "VLDL", "LDLCALC", "LDLDIRECT"  Physical Exam:    VS:  BP 120/80   Pulse 60   Ht 5\' 8"  (1.727 m)   Wt 195 lb (88.5 kg)   SpO2 97%   BMI 29.65 kg/m     Wt Readings from Last 3 Encounters:  03/31/24 195 lb (88.5 kg)  09/16/23 184 lb (83.5 kg)  08/11/23 188 lb 12.8 oz (85.6 kg)     GENERAL:  Well nourished, well developed in no acute distress CARDIAC: RRR, S1 and S2 present, no murmurs, no rubs, no gallops CHEST:  Clear to auscultation without rales, wheezing or rhonchi  Extremities: No pitting pedal edema. Pulses bilaterally symmetric with radial 2+ and dorsalis pedis 2+ NEUROLOGIC:  Alert and oriented x 3  Medication Adjustments/Labs and Tests Ordered: Current medicines are reviewed at length with the patient today.  Concerns regarding medicines are  outlined above.  No orders of the defined types were placed in this encounter.  No orders of the defined types were placed in this encounter.   Signed, Lura Sallies, MD, MPH, [facility]. 03/31/2024 4:31 PM    [facility]

## 2024-03-31 NOTE — Assessment & Plan Note (Signed)
 Well-controlled. Target below 130/80 mmHg. Continue amlodipine  10 mg once daily Hydrochlorothiazide  25 mg once daily.

## 2024-08-07 ENCOUNTER — Other Ambulatory Visit: Payer: Self-pay

## 2024-09-10 ENCOUNTER — Other Ambulatory Visit: Payer: Self-pay

## 2024-12-15 ENCOUNTER — Ambulatory Visit

## 2024-12-15 ENCOUNTER — Ambulatory Visit: Admitting: Podiatry

## 2024-12-15 ENCOUNTER — Encounter: Payer: Self-pay | Admitting: Podiatry

## 2024-12-15 DIAGNOSIS — M722 Plantar fascial fibromatosis: Secondary | ICD-10-CM

## 2024-12-15 NOTE — Patient Instructions (Signed)

## 2024-12-15 NOTE — Progress Notes (Unsigned)
 Volt Ns Has sleeve
